# Patient Record
Sex: Female | Born: 1980 | Race: Black or African American | Hispanic: No | Marital: Single | Smoking: Never smoker
Health system: Southern US, Community
[De-identification: ages and names within clinical notes are randomized; demographics above are authoritative.]

## PROBLEM LIST (undated history)

## (undated) DIAGNOSIS — N179 Acute kidney failure, unspecified: Secondary | ICD-10-CM

## (undated) DIAGNOSIS — D7582 Heparin induced thrombocytopenia (HIT): Secondary | ICD-10-CM

## (undated) DIAGNOSIS — I469 Cardiac arrest, cause unspecified: Secondary | ICD-10-CM

## (undated) DIAGNOSIS — J9621 Acute and chronic respiratory failure with hypoxia: Secondary | ICD-10-CM

## (undated) DIAGNOSIS — D352 Benign neoplasm of pituitary gland: Secondary | ICD-10-CM

## (undated) DIAGNOSIS — G931 Anoxic brain damage, not elsewhere classified: Secondary | ICD-10-CM

## (undated) DIAGNOSIS — J181 Lobar pneumonia, unspecified organism: Secondary | ICD-10-CM

## (undated) DIAGNOSIS — I2699 Other pulmonary embolism without acute cor pulmonale: Secondary | ICD-10-CM

## (undated) DIAGNOSIS — E232 Diabetes insipidus: Secondary | ICD-10-CM

## (undated) DIAGNOSIS — D75829 Heparin-induced thrombocytopenia, unspecified: Secondary | ICD-10-CM

## (undated) DIAGNOSIS — E230 Hypopituitarism: Secondary | ICD-10-CM

## (undated) HISTORY — PX: TRACHEOSTOMY: SUR1362

## (undated) HISTORY — PX: OTHER SURGICAL HISTORY: SHX169

## (undated) HISTORY — PX: PEG PLACEMENT: SHX5437

---

## 2017-07-24 ENCOUNTER — Inpatient Hospital Stay
Admission: AC | Admit: 2017-07-24 | Discharge: 2017-09-17 | Disposition: A | Discharge status: Skilled Nursing Facility | Payer: Medicaid Other | Source: Other Acute Inpatient Hospital | Attending: Internal Medicine | Admitting: Internal Medicine

## 2017-07-24 ENCOUNTER — Other Ambulatory Visit (HOSPITAL_COMMUNITY): Payer: Medicaid Other

## 2017-07-24 DIAGNOSIS — E230 Hypopituitarism: Secondary | ICD-10-CM

## 2017-07-24 DIAGNOSIS — Z931 Gastrostomy status: Secondary | ICD-10-CM

## 2017-07-24 DIAGNOSIS — J181 Lobar pneumonia, unspecified organism: Secondary | ICD-10-CM

## 2017-07-24 DIAGNOSIS — K9423 Gastrostomy malfunction: Secondary | ICD-10-CM

## 2017-07-24 DIAGNOSIS — N179 Acute kidney failure, unspecified: Secondary | ICD-10-CM

## 2017-07-24 DIAGNOSIS — Z4659 Encounter for fitting and adjustment of other gastrointestinal appliance and device: Secondary | ICD-10-CM

## 2017-07-24 DIAGNOSIS — I2699 Other pulmonary embolism without acute cor pulmonale: Secondary | ICD-10-CM | POA: Diagnosis present

## 2017-07-24 DIAGNOSIS — J9621 Acute and chronic respiratory failure with hypoxia: Secondary | ICD-10-CM

## 2017-07-24 DIAGNOSIS — Z431 Encounter for attention to gastrostomy: Secondary | ICD-10-CM

## 2017-07-24 DIAGNOSIS — J9690 Respiratory failure, unspecified, unspecified whether with hypoxia or hypercapnia: Secondary | ICD-10-CM

## 2017-07-24 DIAGNOSIS — G931 Anoxic brain damage, not elsewhere classified: Secondary | ICD-10-CM

## 2017-07-24 DIAGNOSIS — R109 Unspecified abdominal pain: Secondary | ICD-10-CM

## 2017-07-24 DIAGNOSIS — Z0189 Encounter for other specified special examinations: Secondary | ICD-10-CM

## 2017-07-24 HISTORY — DX: Cardiac arrest, cause unspecified: I46.9

## 2017-07-24 HISTORY — DX: Heparin induced thrombocytopenia (HIT): D75.82

## 2017-07-24 HISTORY — DX: Benign neoplasm of pituitary gland: D35.2

## 2017-07-24 HISTORY — DX: Hypopituitarism: E23.0

## 2017-07-24 HISTORY — DX: Acute and chronic respiratory failure with hypoxia: J96.21

## 2017-07-24 HISTORY — DX: Acute kidney failure, unspecified: N17.9

## 2017-07-24 HISTORY — DX: Other pulmonary embolism without acute cor pulmonale: I26.99

## 2017-07-24 HISTORY — DX: Diabetes insipidus: E23.2

## 2017-07-24 HISTORY — DX: Anoxic brain damage, not elsewhere classified: G93.1

## 2017-07-24 HISTORY — DX: Heparin-induced thrombocytopenia, unspecified: D75.829

## 2017-07-24 HISTORY — DX: Lobar pneumonia, unspecified organism: J18.1

## 2017-07-24 LAB — APTT: aPTT: 69 seconds — ABNORMAL HIGH (ref 24–36)

## 2017-07-24 MED ORDER — IOPAMIDOL (ISOVUE-300) INJECTION 61%
INTRAVENOUS | Status: AC
  Administered 2017-07-24: 50 mL via GASTROSTOMY
  Filled 2017-07-24: qty 50

## 2017-07-25 ENCOUNTER — Encounter: Payer: Self-pay | Admitting: Internal Medicine

## 2017-07-25 DIAGNOSIS — J9621 Acute and chronic respiratory failure with hypoxia: Secondary | ICD-10-CM | POA: Diagnosis not present

## 2017-07-25 DIAGNOSIS — N179 Acute kidney failure, unspecified: Secondary | ICD-10-CM | POA: Diagnosis not present

## 2017-07-25 DIAGNOSIS — E230 Hypopituitarism: Secondary | ICD-10-CM

## 2017-07-25 DIAGNOSIS — G931 Anoxic brain damage, not elsewhere classified: Secondary | ICD-10-CM

## 2017-07-25 DIAGNOSIS — I2699 Other pulmonary embolism without acute cor pulmonale: Secondary | ICD-10-CM

## 2017-07-25 DIAGNOSIS — J181 Lobar pneumonia, unspecified organism: Secondary | ICD-10-CM | POA: Diagnosis not present

## 2017-07-25 HISTORY — DX: Acute and chronic respiratory failure with hypoxia: J96.21

## 2017-07-25 HISTORY — DX: Acute kidney failure, unspecified: N17.9

## 2017-07-25 HISTORY — DX: Lobar pneumonia, unspecified organism: J18.1

## 2017-07-25 HISTORY — DX: Hypopituitarism: E23.0

## 2017-07-25 HISTORY — DX: Anoxic brain damage, not elsewhere classified: G93.1

## 2017-07-25 LAB — APTT
APTT: 90 s — AB (ref 24–36)
aPTT: 56 seconds — ABNORMAL HIGH (ref 24–36)
aPTT: 102 seconds — ABNORMAL HIGH (ref 24–36)

## 2017-07-25 LAB — PROTIME-INR
INR: 6.5 — AB
INR: 2.65
INR: 6.62
Prothrombin Time: 56.6 seconds — ABNORMAL HIGH (ref 11.4–15.2)
Prothrombin Time: 28.1 seconds — ABNORMAL HIGH (ref 11.4–15.2)
Prothrombin Time: 57.3 seconds — ABNORMAL HIGH (ref 11.4–15.2)

## 2017-07-25 LAB — COMPREHENSIVE METABOLIC PANEL
ALBUMIN: 2.7 g/dL — AB (ref 3.5–5.0)
ALK PHOS: 95 U/L (ref 38–126)
ALT: 20 U/L (ref 14–54)
AST: 27 U/L (ref 15–41)
Anion gap: 11 (ref 5–15)
BUN: 19 mg/dL (ref 6–20)
CALCIUM: 9.4 mg/dL (ref 8.9–10.3)
CHLORIDE: 102 mmol/L (ref 101–111)
CO2: 33 mmol/L — AB (ref 22–32)
Creatinine, Ser: 0.77 mg/dL (ref 0.44–1.00)
GFR calc Af Amer: >60 mL/min (ref >60)
GFR calc non Af Amer: >60 mL/min (ref >60)
GLUCOSE: 83 mg/dL (ref 65–99)
Potassium: 3.4 mmol/L — ABNORMAL LOW (ref 3.5–5.1)
SODIUM: 146 mmol/L — AB (ref 135–145)
Total Bilirubin: 0.8 mg/dL (ref 0.3–1.2)
Total Protein: 6.4 g/dL — ABNORMAL LOW (ref 6.5–8.1)

## 2017-07-25 LAB — CBC
HCT: 31.3 % — ABNORMAL LOW (ref 36.0–46.0)
HEMOGLOBIN: 9.3 g/dL — AB (ref 12.0–15.0)
MCH: 25.5 pg — AB (ref 26.0–34.0)
MCHC: 29.7 g/dL — AB (ref 30.0–36.0)
MCV: 85.8 fL (ref 78.0–100.0)
Platelets: 363 10*3/uL (ref 150–400)
RBC: 3.65 MIL/uL — AB (ref 3.87–5.11)
RDW: 16.5 % — ABNORMAL HIGH (ref 11.5–15.5)
WBC: 11.6 10*3/uL — ABNORMAL HIGH (ref 4.0–10.5)

## 2017-07-25 LAB — C DIFFICILE QUICK SCREEN W PCR REFLEX
C DIFFICILE (CDIFF) TOXIN: NEGATIVE
C DIFFICLE (CDIFF) ANTIGEN: POSITIVE — AB

## 2017-07-25 LAB — TSH: TSH: 0.13 u[IU]/mL — ABNORMAL LOW (ref 0.350–4.500)

## 2017-07-25 LAB — CLOSTRIDIUM DIFFICILE BY PCR, REFLEXED: CDIFFPCR: POSITIVE — AB

## 2017-07-25 NOTE — Consult Note (Signed)
Pulmonary [place]  PULMONARY SERVICE  Date of Service: 07/25/2017  PULMONARY CONSULT   Debra Sanford  KVQ:[id number]  DOB: Mar 03, 1980   DOA: 07/24/2017  Referring Physician: Merton Border, MD  HPI: Debra Sanford is a 37 y.o. female seen for follow up of Acute on Chronic Respiratory Failure.  This is an unfortunate female who presented to the hospital because she was not feeling well.  Apparently was at home told her daughter that she was not feeling well and developed sudden onset of shortness of breath and was noted to be significantly hypoxic.  The patient was taken to the ED by EMS and developed pulseless electrical activity arrest which was witnessed.  The downtime was anywhere from 3 to 5 minutes.  Patient did require CPR at the time.  Diagnostic work-up showed that patient had a pulmonary embolism.  She been started on heparin initially however she developed heparin-induced thrombocytopenia and since then has been switched over to warfarin.  Other complications that occurred during her stay included development of acute renal failure.  She required dialysis in the form of CRRT but now apparently has been off of the dialysis.  At the time that she is seen she is nonverbal she is on a tracheostomy and is on T collar.  Review of Systems:  ROS performed and is unremarkable other than noted above.  Past Medical History:  Diagnosis Date  . Acute on chronic respiratory failure with hypoxia ([place]) 07/25/2017  . Acute renal failure (ARF) ([place]) 07/25/2017  . Cardiac arrest ([place])   . Chronic anoxic encephalopathy ([place]) 07/25/2017  . Diabetes insipidus ([place])   . HIT (heparin-induced thrombocytopenia) ([place])   . Lobar pneumonia ([place]) 07/25/2017  . 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 Pituitary adenoma ([place])   . Pulmonary embolism [facility])     Past Surgical History:  Procedure Laterality Date   . PEG PLACEMENT    . TRACHEOSTOMY    . TSS      Social History:    reports that she has never smoked. She has never used smokeless tobacco. She reports that she drank alcohol. She reports that she has current or past drug history.  Family History: Non-Contributory to the present illness  Allergies not on file  Medications: Reviewed on Rounds  Physical Exam:  Vitals: Temperature is 98.7 pulse 86 respiratory rate 22 blood pressure 121/76 saturations 100%  Ventilator Settings off of the ventilator on T collar  . General: Comfortable at this time . Eyes: Grossly normal lids, irises & conjunctiva . ENT: grossly tongue is normal . Neck: no obvious mass . Cardiovascular: S1-S2 normal no gallop or rub . Respiratory: Coarse rhonchi noted bilaterally expansion is equal . Abdomen: Morbidly obese . Skin: no rash seen on limited exam . Musculoskeletal: not rigid . Psychiatric:unable to assess . Neurologic: no seizure no involuntary movements         Labs on Admission:  Basic Metabolic Panel: Recent Labs  Lab 07/25/17 0531  NA 146*  K 3.4*  CL 102  CO2 33*  GLUCOSE 83  BUN 19  CREATININE 0.77  CALCIUM 9.4    Liver Function Tests: Recent Labs  Lab 07/25/17 0531  AST 27  ALT 20  ALKPHOS 95  BILITOT 0.8  PROT 6.4*  ALBUMIN 2.7*   No results for input(s): LIPASE, AMYLASE in the last 168 hours. No results for input(s): AMMONIA in the last 168 hours.  CBC: Recent Labs  Lab 07/25/17 0531  WBC 11.6*  HGB 9.3*  HCT 31.3*  MCV 85.8  PLT 363    Cardiac Enzymes: No results for input(s): CKTOTAL, CKMB, CKMBINDEX, TROPONINI in the last 168 hours.  BNP (last 3 results) No results for input(s): BNP in the last 8760 hours.  ProBNP (last 3 results) No results for input(s): PROBNP in the last 8760 hours.   Radiological Exams on Admission: Dg Abdomen Peg Tube Location  Result Date: 07/24/2017 CLINICAL DATA:  Evaluate PEG tube. EXAM: ABDOMEN - 1 VIEW  COMPARISON:  None. FINDINGS: A PEG tube projects over the gastric body. Contrast has been injected through the tube entering the stomach and proximal duodenum with no evidence of leak. IMPRESSION: Peg tube placement as above.  No evidence of leak after injection. Electronically Signed   By: Dorise Bullion III M.D   On: 07/24/2017 21:56   Dg Chest Port 1 View  Result Date: 07/24/2017 CLINICAL DATA:  Patient with respiratory failure EXAM: PORTABLE CHEST 1 VIEW COMPARISON:  None. FINDINGS: Central venous catheter tip projects over the superior vena cava. ET tube terminates in the mid trachea. Marked enlargement of the cardiopericardial silhouette. Low lung volumes. Bilateral interstitial pulmonary opacities. Probable small left pleural effusion. IMPRESSION: Marked enlargement of the cardiopericardial silhouette which may represent marked cardiomegaly and/or pericardial effusion. Bilateral interstitial opacities favored to represent pulmonary edema. Small left pleural effusion. ET tube mid trachea. Electronically Signed   By: Lovey Newcomer M.D.   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 Acute on chronic respiratory failure with hypoxia she has had a prolonged course right now her state of consciousness is the main issue.  She is on T collar and has been weaned down successfully to this level.  My concern would be if she were going to be able to eventually decannulate her we will need to first downsize her change her trach and then assess for PMV and capping. 2. Lobar pneumonia she is been treated with antibiotics the last chest x-ray showed some interstitial infiltrates consistent with may be some fluid  overload. 3. Panhypopituitarism continue with supportive care 4. Pulmonary embolism on anticoagulation we will continue present management. 5. Chronic anoxic encephalopathy we will continue present management 6. Acute renal failure monitor labs currently is off dialysis  I have personally seen and evaluated the patient, evaluated laboratory and imaging results, formulated the assessment and plan and placed orders. The Patient requires high complexity decision making for assessment and support.  Case was discussed on Rounds with the Respiratory Therapy Staff  Time Spent 54minutes reviewing the chart radiological studies and discussion with the medical staff and primary care physician and the father was at the bedside  Allyne Gee, MD [facility]

## 2017-07-26 DIAGNOSIS — N179 Acute kidney failure, unspecified: Secondary | ICD-10-CM | POA: Diagnosis not present

## 2017-07-26 DIAGNOSIS — J181 Lobar pneumonia, unspecified organism: Secondary | ICD-10-CM | POA: Diagnosis not present

## 2017-07-26 DIAGNOSIS — G931 Anoxic brain damage, not elsewhere classified: Secondary | ICD-10-CM | POA: Diagnosis not present

## 2017-07-26 DIAGNOSIS — J9621 Acute and chronic respiratory failure with hypoxia: Secondary | ICD-10-CM | POA: Diagnosis not present

## 2017-07-26 LAB — APTT: APTT: 84 s — AB (ref 24–36)

## 2017-07-26 LAB — PROTIME-INR
INR: 3.52
PROTHROMBIN TIME: 35.1 s — AB (ref 11.4–15.2)

## 2017-07-26 NOTE — Progress Notes (Signed)
Pulmonary [place]   PULMONARY SERVICE  PROGRESS NOTE  Date of Service: 07/26/2017  Debra Sanford  [id number]  DOB: [birth year]   DOA: 07/24/2017  Referring Physician: Merton Border, MD  HPI: Debra Sanford is a 37 y.o. female seen for follow up of Acute on Chronic Respiratory Failure.  She is on T collar today has been on 20% oxygen tank fairly well at baseline no distress is noted at this time.  Medications: Reviewed on Rounds  Physical Exam:  Vitals: Temperature 98.0 pulse 61 respiratory rate 12 blood pressure 142/97 saturations 100%  Ventilator Settings currently is off the ventilator on T collar has been on 20% oxygen.  . General: Comfortable at this time . Eyes: Grossly normal lids, irises & conjunctiva . ENT: grossly tongue is normal . Neck: no obvious mass . Cardiovascular: S1 S2 normal no gallop . Respiratory: Coarse breath sounds no rhonchi are noted . Abdomen: soft . Skin: no rash seen on limited exam . Musculoskeletal: not rigid . Psychiatric:unable to assess . Neurologic: no seizure no involuntary movements         Labs on Admission:  Basic Metabolic Panel: Recent Labs  Lab 07/25/17 0531  NA 146*  K 3.4*  CL 102  CO2 33*  GLUCOSE 83  BUN 19  CREATININE 0.77  CALCIUM 9.4    Liver Function Tests: Recent Labs  Lab 07/25/17 0531  AST 27  ALT 20  ALKPHOS 95  BILITOT 0.8  PROT 6.4*  ALBUMIN 2.7*   No results for input(s): LIPASE, AMYLASE in the last 168 hours. No results for input(s): AMMONIA in the last 168 hours.  CBC: Recent Labs  Lab 07/25/17 0531  WBC 11.6*  HGB 9.3*  HCT 31.3*  MCV 85.8  PLT 363    Cardiac Enzymes: No results for input(s): CKTOTAL, CKMB, CKMBINDEX, TROPONINI in the last 168 hours.  BNP (last 3 results) No results for input(s): BNP in the last 8760 hours.  ProBNP (last 3 results) No results for input(s): PROBNP in the last 8760  hours.  Radiological Exams on Admission: Dg Abdomen Peg Tube Location  Result Date: 07/24/2017 CLINICAL DATA:  Evaluate PEG tube. EXAM: ABDOMEN - 1 VIEW COMPARISON:  None. FINDINGS: A PEG tube projects over the gastric body. Contrast has been injected through the tube entering the stomach and proximal duodenum with no evidence of leak. IMPRESSION: Peg tube placement as above.  No evidence of leak after injection. Electronically Signed   By: Dorise Bullion III M.D   On: 07/24/2017 21:56   Dg Chest Port 1 View  Result Date: 07/24/2017 CLINICAL DATA:  Patient with respiratory failure EXAM: PORTABLE CHEST 1 VIEW COMPARISON:  None. FINDINGS: Central venous catheter tip projects over the superior vena cava. ET tube terminates in the mid trachea. Marked enlargement of the cardiopericardial silhouette. Low lung volumes. Bilateral interstitial pulmonary opacities. Probable small left pleural effusion. IMPRESSION: Marked enlargement of the cardiopericardial silhouette which may represent marked cardiomegaly and/or pericardial effusion. Bilateral interstitial opacities favored to represent pulmonary edema. Small left pleural effusion. ET tube mid trachea. Electronically Signed   By: Lovey Newcomer M.D.   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 Acute  on chronic respiratory failure with hypoxia we will continue with T collar trials.  Respiratory we will continue to assess for potential of capping which I think is going to be somewhat difficult secondary to probable severe OSA we will make that determination after initial evaluation with PMV. 2. Lobar pneumonia treated with antibiotics we will continue with supportive care follow-up  x-ray showing bilateral interstitial infiltrates. 3. Panhypopituitarism we will continue with present management. 4. Acute renal failure resolved we will continue to monitor 5. Anoxic encephalopathy at baseline no changes noted 6. Pulmonary embolism continue with present management prognosis guarded   I have personally seen and evaluated the patient, evaluated laboratory and imaging results, formulated the assessment and plan and placed orders. The Patient requires high complexity decision making for assessment and support.  Case was discussed on Rounds with the Respiratory Therapy Staff  Allyne Gee, MD [facility] Pulmonary Critical Care Medicine Sleep Medicine

## 2017-07-27 DIAGNOSIS — N179 Acute kidney failure, unspecified: Secondary | ICD-10-CM | POA: Diagnosis not present

## 2017-07-27 DIAGNOSIS — J9621 Acute and chronic respiratory failure with hypoxia: Secondary | ICD-10-CM | POA: Diagnosis not present

## 2017-07-27 DIAGNOSIS — G931 Anoxic brain damage, not elsewhere classified: Secondary | ICD-10-CM | POA: Diagnosis not present

## 2017-07-27 DIAGNOSIS — J181 Lobar pneumonia, unspecified organism: Secondary | ICD-10-CM | POA: Diagnosis not present

## 2017-07-27 LAB — PROTIME-INR
INR: >10
Prothrombin Time: >90 seconds — ABNORMAL HIGH (ref 11.4–15.2)

## 2017-07-27 LAB — APTT: aPTT: >200 seconds (ref 24–36)

## 2017-07-27 NOTE — Progress Notes (Signed)
[name]   PULMONARY SERVICE  PROGRESS NOTE  Date of Service: 07/27/2017  Debra Sanford  [id number]  DOB: [birth year]   DOA: 07/24/2017  Referring Physician: Merton Border, MD  HPI: Debra Sanford is a 37 y.o. female seen for follow up of Acute on Chronic Respiratory Failure.  Currently is on T collar is on 20% oxygen.  Patient has been apparently having some bradycardia events noted.  He patient also has been having some thermoregulatory problems likely related to her tumor issues.  Medications: Reviewed on Rounds  Physical Exam:  Vitals: Temperature 97.1 pulse 61 respiratory rate 14 blood pressure 148/68 saturations 98%  Ventilator Settings off of the ventilator on T collar 28% FiO2  . General: Comfortable at this time . Eyes: Grossly normal lids, irises & conjunctiva . ENT: grossly tongue is normal . Neck: no obvious mass . Cardiovascular: S1 S2 normal no gallop . Respiratory: No rhonchi expansion is equal . Abdomen: soft . Skin: no rash seen on limited exam . Musculoskeletal: not rigid . Psychiatric:unable to assess . Neurologic: no seizure no involuntary movements         Labs on Admission:  Basic Metabolic Panel: Recent Labs  Lab 07/25/17 0531  NA 146*  K 3.4*  CL 102  CO2 33*  GLUCOSE 83  BUN 19  CREATININE 0.77  CALCIUM 9.4    Liver Function Tests: Recent Labs  Lab 07/25/17 0531  AST 27  ALT 20  ALKPHOS 95  BILITOT 0.8  PROT 6.4*  ALBUMIN 2.7*   No results for input(s): LIPASE, AMYLASE in the last 168 hours. No results for input(s): AMMONIA in the last 168 hours.  CBC: Recent Labs  Lab 07/25/17 0531  WBC 11.6*  HGB 9.3*  HCT 31.3*  MCV 85.8  PLT 363    Cardiac Enzymes: No results for input(s): CKTOTAL, CKMB, CKMBINDEX, TROPONINI in the last 168 hours.  BNP (last 3 results) No results for input(s): BNP in the last 8760 hours.  ProBNP (last 3 results) No  results for input(s): PROBNP in the last 8760 hours.  Radiological Exams on Admission: Dg Abdomen Peg Tube Location  Result Date: 07/24/2017 CLINICAL DATA:  Evaluate PEG tube. EXAM: ABDOMEN - 1 VIEW COMPARISON:  None. FINDINGS: A PEG tube projects over the gastric body. Contrast has been injected through the tube entering the stomach and proximal duodenum with no evidence of leak. IMPRESSION: Peg tube placement as above.  No evidence of leak after injection. Electronically Signed   By: Dorise Bullion III M.D   On: 07/24/2017 21:56   Dg Chest Port 1 View  Result Date: 07/24/2017 CLINICAL DATA:  Patient with respiratory failure EXAM: PORTABLE CHEST 1 VIEW COMPARISON:  None. FINDINGS: Central venous catheter tip projects over the superior vena cava. ET tube terminates in the mid trachea. Marked enlargement of the cardiopericardial silhouette. Low lung volumes. Bilateral interstitial pulmonary opacities. Probable small left pleural effusion. IMPRESSION: Marked enlargement of the cardiopericardial silhouette which may represent marked cardiomegaly and/or pericardial effusion. Bilateral interstitial opacities favored to represent pulmonary edema. Small left pleural effusion. ET tube mid trachea. Electronically Signed   By: Lovey Newcomer M.D.   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 Acute on chronic respiratory failure with hypoxia right now will remain on T collar difficulty with decannulation secondary to her size and underlying sleep apnea. 2. Panhypopituitarism we will continue with supportive care monitor labs closely 3. Acute renal failure follow labs doing well 4. Anoxic encephalopathy she is at  baseline 5. Pulmonary embolism status post IVC filter 6. Lobar pneumonia treated with antibiotics we will continue to monitor   I have personally seen and evaluated the patient, evaluated laboratory and imaging results, formulated the assessment and plan and placed orders. The Patient requires high complexity decision making for assessment and support.  Case was discussed on Rounds with the Respiratory Therapy Staff  Allyne Gee, MD [facility] Pulmonary Critical Care Medicine Sleep Medicine

## 2017-07-28 ENCOUNTER — Other Ambulatory Visit (HOSPITAL_COMMUNITY): Payer: Medicaid Other

## 2017-07-28 DIAGNOSIS — N179 Acute kidney failure, unspecified: Secondary | ICD-10-CM | POA: Diagnosis not present

## 2017-07-28 DIAGNOSIS — J9621 Acute and chronic respiratory failure with hypoxia: Secondary | ICD-10-CM | POA: Diagnosis not present

## 2017-07-28 DIAGNOSIS — G931 Anoxic brain damage, not elsewhere classified: Secondary | ICD-10-CM | POA: Diagnosis not present

## 2017-07-28 DIAGNOSIS — J181 Lobar pneumonia, unspecified organism: Secondary | ICD-10-CM | POA: Diagnosis not present

## 2017-07-28 LAB — BASIC METABOLIC PANEL
Anion gap: 16 — ABNORMAL HIGH (ref 5–15)
BUN: 31 mg/dL — AB (ref 6–20)
CALCIUM: 9.2 mg/dL (ref 8.9–10.3)
CO2: 27 mmol/L (ref 22–32)
Chloride: 102 mmol/L (ref 101–111)
Creatinine, Ser: 1.52 mg/dL — ABNORMAL HIGH (ref 0.44–1.00)
GFR calc Af Amer: 50 mL/min — ABNORMAL LOW (ref >60)
GFR, EST NON AFRICAN AMERICAN: 43 mL/min — AB (ref >60)
GLUCOSE: 115 mg/dL — AB (ref 65–99)
Potassium: 3.3 mmol/L — ABNORMAL LOW (ref 3.5–5.1)
Sodium: 145 mmol/L (ref 135–145)

## 2017-07-28 LAB — BLOOD GAS, ARTERIAL
Acid-Base Excess: 5.5 mmol/L — ABNORMAL HIGH (ref 0.0–2.0)
Bicarbonate: 29.6 mmol/L — ABNORMAL HIGH (ref 20.0–28.0)
FIO2: 40
O2 SAT: 96.2 %
PO2 ART: 86.9 mmHg (ref 83.0–108.0)
Patient temperature: 98.6
pCO2 arterial: 44 mmHg (ref 32.0–48.0)
pH, Arterial: 7.442 (ref 7.350–7.450)

## 2017-07-28 LAB — CBC WITH DIFFERENTIAL/PLATELET
Basophils Absolute: 0.1 K/uL (ref 0.0–0.1)
Basophils Relative: 1 %
Eosinophils Absolute: 0 K/uL (ref 0.0–0.7)
Eosinophils Relative: 0 %
HCT: 42.9 % (ref 36.0–46.0)
Hemoglobin: 12.3 g/dL (ref 12.0–15.0)
Lymphocytes Relative: 26 %
Lymphs Abs: 2.5 K/uL (ref 0.7–4.0)
MCH: 24.2 pg — ABNORMAL LOW (ref 26.0–34.0)
MCHC: 28.7 g/dL — ABNORMAL LOW (ref 30.0–36.0)
MCV: 84.3 fL (ref 78.0–100.0)
Monocytes Absolute: 0.4 K/uL (ref 0.1–1.0)
Monocytes Relative: 4 %
Neutro Abs: 6.7 K/uL (ref 1.7–7.7)
Neutrophils Relative %: 69 %
Platelets: 539 K/uL — ABNORMAL HIGH (ref 150–400)
RBC: 5.09 MIL/uL (ref 3.87–5.11)
RDW: 16.9 % — ABNORMAL HIGH (ref 11.5–15.5)
WBC Morphology: INCREASED
WBC: 9.7 K/uL (ref 4.0–10.5)

## 2017-07-28 LAB — PROTIME-INR
INR: 3.72
Prothrombin Time: 36.6 s — ABNORMAL HIGH (ref 11.4–15.2)

## 2017-07-28 MED ORDER — IOPAMIDOL (ISOVUE-300) INJECTION 61%
INTRAVENOUS | Status: AC
  Administered 2017-07-28: 30 mL via GASTROSTOMY
  Filled 2017-07-28: qty 50

## 2017-07-28 NOTE — Progress Notes (Signed)
Pulmonary [place]   PULMONARY SERVICE  PROGRESS NOTE  Date of Service: 07/28/2017  Debra Sanford  [id number]  DOB: 09-11-1980   DOA: 07/24/2017  Referring Physician: Merton Border, MD  HPI: Debra Sanford is a 37 y.o. female seen for follow up of Acute on Chronic Respiratory Failure.  Currently on T collar trials doing fairly well.  Has had episodes with tachycardia noted now is doing a little bit better likely neurologically mediated  Medications: Reviewed on Rounds  Physical Exam:  Vitals: Temperature 98.7 pulse 122 respiratory rate 30 blood pressure 100/54 saturations 93%  Ventilator Settings off the ventilator on T collar 28%  . General: Comfortable at this time . Eyes: Grossly normal lids, irises & conjunctiva . ENT: grossly tongue is normal . Neck: no obvious mass . Cardiovascular: S1 S2 normal no gallop . Respiratory: Scattered rhonchi . Abdomen: soft . Skin: no rash seen on limited exam . Musculoskeletal: not rigid . Psychiatric:unable to assess . Neurologic: no seizure no involuntary movements         Lab Data:   Basic Metabolic Panel: Recent Labs  Lab 07/25/17 0531 07/28/17 0829  NA 146* 145  K 3.4* 3.3*  CL 102 102  CO2 33* 27  GLUCOSE 83 115*  BUN 19 31*  CREATININE 0.77 1.52*  CALCIUM 9.4 9.2    Liver Function Tests: Recent Labs  Lab 07/25/17 0531  AST 27  ALT 20  ALKPHOS 95  BILITOT 0.8  PROT 6.4*  ALBUMIN 2.7*   No results for input(s): LIPASE, AMYLASE in the last 168 hours. No results for input(s): AMMONIA in the last 168 hours.  CBC: Recent Labs  Lab 07/25/17 0531 07/28/17 0835  WBC 11.6* 9.7  NEUTROABS  --  6.7  HGB 9.3* 12.3  HCT 31.3* 42.9  MCV 85.8 84.3  PLT 363 539*    Cardiac Enzymes: No results for input(s): CKTOTAL, CKMB, CKMBINDEX, TROPONINI in the last 168 hours.  BNP (last 3 results) No results for input(s): BNP in the last 8760 hours.  ProBNP  (last 3 results) No results for input(s): PROBNP in the last 8760 hours.  Radiological Exams: Dg Abdomen Peg Tube Location  Result Date: 07/28/2017 CLINICAL DATA:  Status post replacement of PEG tube. EXAM: ABDOMEN - 1 VIEW COMPARISON:  None. FINDINGS: Contrast injected into the patient's feeding tube opacifies the stomach. IMPRESSION: Feeding tube in good position. Electronically Signed   By: Inge Rise M.D.   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 Acute on chronic respiratory failure with hypoxia we will continue with T collar trials at this time.  Continue pulmonary toilet secretion management.  Hold off on any capping or decannulation for now. 2. Lobar pneumonia treated with antibiotics we will continue supportive care 3. Acute renal failure at baseline we will follow 4. Chronic encephalopathy anoxic encephalopathy unchanged 5. Pulmonary embolism status post IVC filter 6. Panhypopituitarism unchanged   I have personally seen and evaluated the patient, evaluated laboratory and imaging results, formulated the assessment and plan and placed orders. The Patient requires high complexity decision making for assessment and support.  Case was discussed on Rounds with the Respiratory Therapy Staff  Saadat A  Humphrey Rolls, MD [facility] Pulmonary Critical Care Medicine Sleep Medicine

## 2017-07-29 DIAGNOSIS — J181 Lobar pneumonia, unspecified organism: Secondary | ICD-10-CM | POA: Diagnosis not present

## 2017-07-29 DIAGNOSIS — J9621 Acute and chronic respiratory failure with hypoxia: Secondary | ICD-10-CM | POA: Diagnosis not present

## 2017-07-29 DIAGNOSIS — N179 Acute kidney failure, unspecified: Secondary | ICD-10-CM | POA: Diagnosis not present

## 2017-07-29 DIAGNOSIS — G931 Anoxic brain damage, not elsewhere classified: Secondary | ICD-10-CM | POA: Diagnosis not present

## 2017-07-29 LAB — BLOOD GAS, ARTERIAL
ACID-BASE EXCESS: 6.6 mmol/L — AB (ref 0.0–2.0)
BICARBONATE: 30 mmol/L — AB (ref 20.0–28.0)
FIO2: 40
LHR: 20 {breaths}/min
MECHVT: 450 mL
O2 SAT: 91.8 %
PATIENT TEMPERATURE: 99.9
PCO2 ART: 39.8 mmHg (ref 32.0–48.0)
PEEP: 5 cmH2O
PH ART: 7.493 — AB (ref 7.350–7.450)
pO2, Arterial: 65.1 mmHg — ABNORMAL LOW (ref 83.0–108.0)

## 2017-07-29 LAB — PREGNANCY, URINE: Preg Test, Ur: NEGATIVE

## 2017-07-29 NOTE — Progress Notes (Signed)
Pulmonary [place]   PULMONARY SERVICE  PROGRESS NOTE  Date of Service: 07/29/2017  Korayma Hagwood  [id number]  DOB: 1980/11/01   DOA: 07/24/2017  Referring Physician: Merton Border, MD  HPI: Debra Sanford is a 37 y.o. female seen for follow up of Acute on Chronic Respiratory Failure.  Patient is on assist control mode by now is on 40% oxygen with PEEP of 5.  Patient seems to be comfortable without distress at this time.  Apparently had to be placed on the ventilator overnight or drop in saturations  Medications: Reviewed on Rounds  Physical Exam:  Vitals: Temperature 98.2 pulse 120 respiratory rate 18 blood pressure 108/60 saturations 93%  Ventilator Settings mode of ventilation assist control FiO2 40% tidal volume 396 PEEP 5  . General: Comfortable at this time . Eyes: Grossly normal lids, irises & conjunctiva . ENT: grossly tongue is normal . Neck: no obvious mass . Cardiovascular: S1 S2 normal no gallop . Respiratory: Good air entry no rhonchi expansion is equal . Abdomen: soft . Skin: no rash seen on limited exam . Musculoskeletal: not rigid . Psychiatric:unable to assess . Neurologic: no seizure no involuntary movements         Lab Data:   Basic Metabolic Panel: Recent Labs  Lab 07/25/17 0531 07/28/17 0829  NA 146* 145  K 3.4* 3.3*  CL 102 102  CO2 33* 27  GLUCOSE 83 115*  BUN 19 31*  CREATININE 0.77 1.52*  CALCIUM 9.4 9.2    Liver Function Tests: Recent Labs  Lab 07/25/17 0531  AST 27  ALT 20  ALKPHOS 95  BILITOT 0.8  PROT 6.4*  ALBUMIN 2.7*   No results for input(s): LIPASE, AMYLASE in the last 168 hours. No results for input(s): AMMONIA in the last 168 hours.  CBC: Recent Labs  Lab 07/25/17 0531 07/28/17 0835  WBC 11.6* 9.7  NEUTROABS  --  6.7  HGB 9.3* 12.3  HCT 31.3* 42.9  MCV 85.8 84.3  PLT 363 539*    Cardiac Enzymes: No results for input(s): CKTOTAL, CKMB,  CKMBINDEX, TROPONINI in the last 168 hours.  BNP (last 3 results) No results for input(s): BNP in the last 8760 hours.  ProBNP (last 3 results) No results for input(s): PROBNP in the last 8760 hours.  Radiological Exams: Dg Abdomen Peg Tube Location  Result Date: 07/28/2017 CLINICAL DATA:  Status post replacement of PEG tube. EXAM: ABDOMEN - 1 VIEW COMPARISON:  None. FINDINGS: Contrast injected into the patient's feeding tube opacifies the stomach. IMPRESSION: Feeding tube in good position. Electronically Signed   By: Inge Rise M.D.   On: 07/28/2017 15:33   Dg Chest Port 1 View  Result Date: 07/28/2017 CLINICAL DATA:  Respiratory failure EXAM: PORTABLE CHEST 1 VIEW COMPARISON:  07/24/2017 FINDINGS: Tracheostomy tube is noted in satisfactory position. Cardiac shadow remains enlarged. The overall inspiratory effort is poor. Mild interstitial changes are again identified and stable. No new focal abnormality is seen. Right-sided PICC line is again noted and stable. IMPRESSION: No significant change from the prior exam. Electronically Signed   By: Inez Catalina M.D.   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 Acute on chronic respiratory  failure with hypoxia we will continue with full vent support right now we will check the spontaneous breathing index tomorrow and try to resume back her on her wean.  Continue with pulmonary toilet supportive care. 2. Lobar pneumonia treated with antibiotics last chest x-ray showed no significant change with mild interstitial changes noted. 3. Chronic encephalopathy she is at baseline 4. Acute renal failure follow labs 5. Panhypopituitarism continue with supportive care   I  have personally seen and evaluated the patient, evaluated laboratory and imaging results, formulated the assessment and plan and placed orders. The Patient requires high complexity decision making for assessment and support.  Case was discussed on Rounds with the Respiratory Therapy Staff  Allyne Gee, MD [facility] Pulmonary Critical Care Medicine Sleep Medicine

## 2017-07-30 ENCOUNTER — Other Ambulatory Visit (HOSPITAL_COMMUNITY): Payer: Medicaid Other

## 2017-07-30 LAB — BASIC METABOLIC PANEL
ANION GAP: 12 (ref 5–15)
BUN: 56 mg/dL — AB (ref 6–20)
CHLORIDE: 100 mmol/L — AB (ref 101–111)
CO2: 34 mmol/L — ABNORMAL HIGH (ref 22–32)
Calcium: 8.9 mg/dL (ref 8.9–10.3)
Creatinine, Ser: 1.46 mg/dL — ABNORMAL HIGH (ref 0.44–1.00)
GFR calc Af Amer: 53 mL/min — ABNORMAL LOW (ref >60)
GFR calc non Af Amer: 46 mL/min — ABNORMAL LOW (ref >60)
Glucose, Bld: 133 mg/dL — ABNORMAL HIGH (ref 65–99)
POTASSIUM: 3.5 mmol/L (ref 3.5–5.1)
SODIUM: 146 mmol/L — AB (ref 135–145)

## 2017-07-30 LAB — CBC
HCT: 38.7 % (ref 36.0–46.0)
HEMOGLOBIN: 11.4 g/dL — AB (ref 12.0–15.0)
MCH: 23.9 pg — AB (ref 26.0–34.0)
MCHC: 29.5 g/dL — ABNORMAL LOW (ref 30.0–36.0)
MCV: 81.3 fL (ref 78.0–100.0)
Platelets: 321 10*3/uL (ref 150–400)
RBC: 4.76 MIL/uL (ref 3.87–5.11)
RDW: 17.3 % — ABNORMAL HIGH (ref 11.5–15.5)
WBC: 11.5 10*3/uL — ABNORMAL HIGH (ref 4.0–10.5)

## 2017-07-30 LAB — PROTIME-INR
INR: 2.96
PROTHROMBIN TIME: 30.6 s — AB (ref 11.4–15.2)

## 2017-07-30 NOTE — Progress Notes (Addendum)
Patient ID: Debra Sanford, female   DOB: Nov 13, 1980, 37 y.o.   MRN: [id number]  CT today:  IMPRESSION: Gastrostomy tube tip is outside the stomach. Spillage of instilled contents into the abdomen and pelvis with free fluid or free air throughout the abdomen and pelvis suspected. Gastrostomy tube is not safe for use and needs to be replaced. The presence of free fluid/free air limits evaluation for detection of bowel perforation. Clinical correlation recommended. Bibasilar consolidation and pleural effusions.  These results were called by telephone at the time of interpretation on 07/30/2017 at 6:50 am [facility] head nurse , who verbally acknowledged these results. Request to call physician on call with results and not use gastrostomy tube    Discussed with Dr Bascom Levels to pulled G tube Pulled without issue G tube removed in entirety Bandage placed  New orders placed to keep wound clean and dry Npo til healed Must be completely healed before consideration of new placement

## 2017-07-31 DIAGNOSIS — G931 Anoxic brain damage, not elsewhere classified: Secondary | ICD-10-CM | POA: Diagnosis not present

## 2017-07-31 DIAGNOSIS — J181 Lobar pneumonia, unspecified organism: Secondary | ICD-10-CM | POA: Diagnosis not present

## 2017-07-31 DIAGNOSIS — J9621 Acute and chronic respiratory failure with hypoxia: Secondary | ICD-10-CM | POA: Diagnosis not present

## 2017-07-31 DIAGNOSIS — N179 Acute kidney failure, unspecified: Secondary | ICD-10-CM | POA: Diagnosis not present

## 2017-07-31 LAB — PROTIME-INR
INR: 2.03
Prothrombin Time: 22.8 seconds — ABNORMAL HIGH (ref 11.4–15.2)

## 2017-07-31 NOTE — Progress Notes (Signed)
Pulmonary [place]   PULMONARY SERVICE  PROGRESS NOTE  Date of Service: 07/31/2017  Debra Sanford  NKN:[id number]  DOB: [birth year]   DOA: 07/24/2017  Referring Physician: Merton Border, MD  HPI: Debra Sanford is a 37 y.o. female seen for follow up of Acute on Chronic Respiratory Failure.  Patient did about 4 hours on pressure support now is resting back on assist control mode.  Medications: Reviewed on Rounds  Physical Exam:  Vitals: Temperature 98.8 pulse 96 respiratory rate 25 blood pressure 136/86 saturations 96%  Ventilator Settings mode of ventilation assist control FiO2 30% PEEP 5  . General: Comfortable at this time . Eyes: Grossly normal lids, irises & conjunctiva . ENT: grossly tongue is normal . Neck: no obvious mass . Cardiovascular: S1 S2 normal no gallop . Respiratory: No rhonchi or rales expansion is equal . Abdomen: soft . Skin: no rash seen on limited exam . Musculoskeletal: not rigid . Psychiatric:unable to assess . Neurologic: no seizure no involuntary movements         Lab Data:   Basic Metabolic Panel: Recent Labs  Lab 07/25/17 0531 07/28/17 0829 07/30/17 0838  NA 146* 145 146*  K 3.4* 3.3* 3.5  CL 102 102 100*  CO2 33* 27 34*  GLUCOSE 83 115* 133*  BUN 19 31* 56*  CREATININE 0.77 1.52* 1.46*  CALCIUM 9.4 9.2 8.9    Liver Function Tests: Recent Labs  Lab 07/25/17 0531  AST 27  ALT 20  ALKPHOS 95  BILITOT 0.8  PROT 6.4*  ALBUMIN 2.7*   No results for input(s): LIPASE, AMYLASE in the last 168 hours. No results for input(s): AMMONIA in the last 168 hours.  CBC: Recent Labs  Lab 07/25/17 0531 07/28/17 0835 07/30/17 0838  WBC 11.6* 9.7 11.5*  NEUTROABS  --  6.7  --   HGB 9.3* 12.3 11.4*  HCT 31.3* 42.9 38.7  MCV 85.8 84.3 81.3  PLT 363 539* 321    Cardiac Enzymes: No results for input(s): CKTOTAL, CKMB, CKMBINDEX, TROPONINI in the last 168 hours.  BNP (last 3  results) No results for input(s): BNP in the last 8760 hours.  ProBNP (last 3 results) No results for input(s): PROBNP in the last 8760 hours.  Radiological Exams: Ct Abdomen Pelvis Wo Contrast  Result Date: 07/30/2017 CLINICAL DATA:  37 year old female. Feeding tube leaking. Subsequent encounter. EXAM: CT ABDOMEN AND PELVIS WITHOUT CONTRAST TECHNIQUE: Multidetector CT imaging of the abdomen and pelvis was performed following the standard protocol without IV contrast. COMPARISON:  07/28/2017 plain film exam.  No comparison CT. FINDINGS: Lower chest: Bibasilar consolidation/pleural effusions. Hepatobiliary: Enlarged liver. Streak artifact. Lack of contrast. No obvious liver lesion. Gallbladder sludge without calcified gallstone. Pancreas: Taking into account limitation by non contrast imaging, no pancreatic mass or primary pancreatic inflammation. Spleen: Taking into account limitation by non contrast imaging, no splenic lesion. Top-normal size. Adrenals/Urinary Tract: No obstructing stone or hydronephrosis. Taking into account limitation by non contrast imaging, no worrisome renal or adrenal lesion. Noncontrast filled urinary bladder unremarkable. Stomach/Bowel: Gastrostomy tube tip is outside the stomach. Spillage of instilled contents into the abdomen and pelvis with free fluid or free air throughout the abdomen and pelvis suspected. The presence of free fluid/free air limits evaluation for detection of bowel perforation. Vascular/Lymphatic: No abdominal aortic aneurysm. Scattered normal/top-normal size lymph nodes without adenopathy. Reproductive: No obvious uterine or worrisome adnexal lesion. Other: Third spacing of fluid. Musculoskeletal: Sacroiliac joint erosive changes bilaterally.  IMPRESSION: Gastrostomy tube tip is outside the stomach. Spillage of instilled contents into the abdomen and pelvis with free fluid or free air throughout the abdomen and pelvis suspected. Gastrostomy tube is not safe for  use and needs to be replaced. The presence of free fluid/free air limits evaluation for detection of bowel perforation. Clinical correlation recommended. Bibasilar consolidation and pleural effusions. These results were called by telephone at the time of interpretation on 07/30/2017 at 6:50 am [facility] head nurse , who verbally acknowledged these results. Request to call physician on call with results and not use gastrostomy tube. Electronically Signed   By: Genia Del M.D.   On: 07/30/2017 07:05   Dg Abd Portable 1v  Result Date: 07/30/2017 CLINICAL DATA:  37 year old female with nasogastric tube placement. Initial encounter. EXAM: PORTABLE ABDOMEN - 1 VIEW COMPARISON:  07/30/2017 CT. FINDINGS: Nasogastric tube tip projects at the gastric antrum level with side hole at the gastric body level. Prominent heart size. Gas-filled top-normal size transverse colon. IMPRESSION: Nasogastric tube tip projects at the gastric antrum level with side hole at the gastric body level. Electronically Signed   By: Genia Del M.D.   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 Acute on chronic respiratory failure with hypoxia we will continue with weaning on pressure support as tolerated continue secretion management pulmonary toilet 2. Panhypopituitarism at baseline we will monitor 3. Chronic anoxic encephalopathy stable we will monitor. 4. Acute renal failure labs are stabilized we will follow along 5. Pulmonary embolism at baseline treated 6. Lobar pneumonia treated we will monitor   I have personally seen and evaluated the patient, evaluated laboratory and imaging results, formulated the assessment and plan and placed orders. The  Patient requires high complexity decision making for assessment and support.  Case was discussed on Rounds with the Respiratory Therapy Staff  Allyne Gee, MD [facility] Pulmonary Critical Care Medicine Sleep Medicine

## 2017-08-01 DIAGNOSIS — G931 Anoxic brain damage, not elsewhere classified: Secondary | ICD-10-CM | POA: Diagnosis not present

## 2017-08-01 DIAGNOSIS — J9621 Acute and chronic respiratory failure with hypoxia: Secondary | ICD-10-CM | POA: Diagnosis not present

## 2017-08-01 DIAGNOSIS — J181 Lobar pneumonia, unspecified organism: Secondary | ICD-10-CM | POA: Diagnosis not present

## 2017-08-01 DIAGNOSIS — N179 Acute kidney failure, unspecified: Secondary | ICD-10-CM | POA: Diagnosis not present

## 2017-08-01 LAB — CBC
HEMATOCRIT: 33.5 % — AB (ref 36.0–46.0)
Hemoglobin: 9.7 g/dL — ABNORMAL LOW (ref 12.0–15.0)
MCH: 23.7 pg — AB (ref 26.0–34.0)
MCHC: 29 g/dL — AB (ref 30.0–36.0)
MCV: 81.9 fL (ref 78.0–100.0)
Platelets: 238 10*3/uL (ref 150–400)
RBC: 4.09 MIL/uL (ref 3.87–5.11)
RDW: 17.7 % — AB (ref 11.5–15.5)
WBC: 11.4 10*3/uL — ABNORMAL HIGH (ref 4.0–10.5)

## 2017-08-01 LAB — COMPREHENSIVE METABOLIC PANEL
ALK PHOS: 126 U/L (ref 38–126)
ALT: 19 U/L (ref 14–54)
AST: 20 U/L (ref 15–41)
Albumin: 2 g/dL — ABNORMAL LOW (ref 3.5–5.0)
Anion gap: 12 (ref 5–15)
BUN: 41 mg/dL — AB (ref 6–20)
CALCIUM: 8.7 mg/dL — AB (ref 8.9–10.3)
CO2: 32 mmol/L (ref 22–32)
CREATININE: 0.89 mg/dL (ref 0.44–1.00)
Chloride: 104 mmol/L (ref 101–111)
GFR calc Af Amer: >60 mL/min (ref >60)
Glucose, Bld: 173 mg/dL — ABNORMAL HIGH (ref 65–99)
Potassium: 2.7 mmol/L — CL (ref 3.5–5.1)
Sodium: 148 mmol/L — ABNORMAL HIGH (ref 135–145)
Total Bilirubin: 0.6 mg/dL (ref 0.3–1.2)
Total Protein: 6.6 g/dL (ref 6.5–8.1)

## 2017-08-01 LAB — PROTIME-INR
INR: 1.49
Prothrombin Time: 17.9 seconds — ABNORMAL HIGH (ref 11.4–15.2)

## 2017-08-01 LAB — MAGNESIUM: Magnesium: 2.4 mg/dL (ref 1.7–2.4)

## 2017-08-01 LAB — TRIGLYCERIDES: Triglycerides: 213 mg/dL — ABNORMAL HIGH (ref <150)

## 2017-08-01 NOTE — Progress Notes (Signed)
[name]   PULMONARY SERVICE  PROGRESS NOTE  Date of Service: 08/01/2017  Debra Sanford  GEX:[id number]  DOB: Aug 01, 1980   DOA: 07/24/2017  Referring Physician: Merton Border, MD  HPI: Debra Sanford is a 37 y.o. female seen for follow up of Acute on Chronic Respiratory Failure.  Patient is on pressure support and supposed to go for about 8 hours.  Right now is comfortable had a fever temperature was up to 101.4 and fortunately I think again this is likely related to the underlying neurological issues that she has  Medications: Reviewed on Rounds  Physical Exam:  Vitals: Temperature 101.4 pulse 99 respiratory rate 21 blood pressure 142/85 saturation 94%  Ventilator Settings mode of ventilation pressure support FiO2 30% tidal volume 477 saturations 94% pressure support 12/5  . General: Comfortable at this time . Eyes: Grossly normal lids, irises & conjunctiva . ENT: grossly tongue is normal . Neck: no obvious mass . Cardiovascular: S1 S2 normal no gallop . Respiratory: Good air entry no rhonchi expansion is equal . Abdomen: soft . Skin: no rash seen on limited exam . Musculoskeletal: not rigid . Psychiatric:unable to assess . Neurologic: no seizure no involuntary movements         Lab Data:   Basic Metabolic Panel: Recent Labs  Lab 07/28/17 0829 07/30/17 0838 08/01/17 0525  NA 145 146* 148*  K 3.3* 3.5 2.7*  CL 102 100* 104  CO2 27 34* 32  GLUCOSE 115* 133* 173*  BUN 31* 56* 41*  CREATININE 1.52* 1.46* 0.89  CALCIUM 9.2 8.9 8.7*  MG  --   --  2.4    Liver Function Tests: Recent Labs  Lab 08/01/17 0525  AST 20  ALT 19  ALKPHOS 126  BILITOT 0.6  PROT 6.6  ALBUMIN 2.0*   No results for input(s): LIPASE, AMYLASE in the last 168 hours. No results for input(s): AMMONIA in the last 168 hours.  CBC: Recent Labs  Lab 07/28/17 0835 07/30/17 0838 08/01/17 0525  WBC 9.7 11.5* 11.4*   NEUTROABS 6.7  --   --   HGB 12.3 11.4* 9.7*  HCT 42.9 38.7 33.5*  MCV 84.3 81.3 81.9  PLT 539* 321 238    Cardiac Enzymes: No results for input(s): CKTOTAL, CKMB, CKMBINDEX, TROPONINI in the last 168 hours.  BNP (last 3 results) No results for input(s): BNP in the last 8760 hours.  ProBNP (last 3 results) No results for input(s): PROBNP in the last 8760 hours.  Radiological Exams: Dg Abd Portable 1v  Result Date: 07/30/2017 CLINICAL DATA:  37 year old female with nasogastric tube placement. Initial encounter. EXAM: PORTABLE ABDOMEN - 1 VIEW COMPARISON:  07/30/2017 CT. FINDINGS: Nasogastric tube tip projects at the gastric antrum level with side hole at the gastric body level. Prominent heart size. Gas-filled top-normal size transverse colon. IMPRESSION: Nasogastric tube tip projects at the gastric antrum level with side hole at the gastric body level. Electronically Signed   By: Genia Del M.D.   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 Acute on chronic respiratory failure with hypoxia patient is tolerating the wean we will continue to advance to wean as tolerated.  Patient's been doing fine  on pressure support as needed. 2. Lobar pneumonia treated with antibiotics we will continue to follow 3. Chronic conditions anoxic encephalopathy stable we will monitor 4. Acute renal failure labs are stable we will continue to follow along 5. Panhypopituitarism at baseline   I have personally seen and evaluated the patient, evaluated laboratory and imaging results, formulated the assessment and plan and placed orders. The Patient requires high complexity decision making for assessment and support.  Case was  discussed on Rounds with the Respiratory Therapy Staff  Allyne Gee, MD [facility] Pulmonary Critical Care Medicine Sleep Medicine

## 2017-08-02 DIAGNOSIS — J181 Lobar pneumonia, unspecified organism: Secondary | ICD-10-CM | POA: Diagnosis not present

## 2017-08-02 DIAGNOSIS — I2699 Other pulmonary embolism without acute cor pulmonale: Secondary | ICD-10-CM | POA: Diagnosis not present

## 2017-08-02 DIAGNOSIS — E230 Hypopituitarism: Secondary | ICD-10-CM | POA: Diagnosis not present

## 2017-08-02 DIAGNOSIS — N179 Acute kidney failure, unspecified: Secondary | ICD-10-CM | POA: Diagnosis not present

## 2017-08-02 DIAGNOSIS — J9621 Acute and chronic respiratory failure with hypoxia: Secondary | ICD-10-CM | POA: Diagnosis not present

## 2017-08-02 DIAGNOSIS — G931 Anoxic brain damage, not elsewhere classified: Secondary | ICD-10-CM | POA: Diagnosis not present

## 2017-08-02 LAB — PROTIME-INR
INR: 1.44
PROTHROMBIN TIME: 17.5 s — AB (ref 11.4–15.2)

## 2017-08-02 LAB — POTASSIUM: Potassium: 3.4 mmol/L — ABNORMAL LOW (ref 3.5–5.1)

## 2017-08-02 NOTE — Progress Notes (Signed)
Pulmonary [place]   PULMONARY SERVICE  PROGRESS NOTE  Date of Service: 08/02/2017  Andre Gallego  VWU:[id number]  DOB: April 26, 1980   DOA: 07/24/2017  Referring Physician: Merton Border, MD  HPI: Debra Sanford is a 37 y.o. female seen for follow up of Acute on Chronic Respiratory Failure.  Patient was able to do about 8 hours of pressure support wean was on 30% oxygen still having low-grade fevers  Medications: Reviewed on Rounds  Physical Exam:  Vitals: Temperature 100.9 pulse 102 respiratory 19 blood pressure 155/94 saturation 93%  Ventilator Settings mode of ventilation pressure support FiO2 30% on 423 pressure support 12 PEEP 5  . General: Comfortable at this time . Eyes: Grossly normal lids, irises & conjunctiva . ENT: grossly tongue is normal . Neck: no obvious mass . Cardiovascular: S1 S2 normal no gallop . Respiratory: Scattered rhonchi are noted bilaterally . Abdomen: soft . Skin: no rash seen on limited exam . Musculoskeletal: not rigid . Psychiatric:unable to assess . Neurologic: no seizure no involuntary movements         Lab Data:   Basic Metabolic Panel: Recent Labs  Lab 07/28/17 0829 07/30/17 0838 08/01/17 0525 08/01/17 2346  NA 145 146* 148*  --   K 3.3* 3.5 2.7* 3.4*  CL 102 100* 104  --   CO2 27 34* 32  --   GLUCOSE 115* 133* 173*  --   BUN 31* 56* 41*  --   CREATININE 1.52* 1.46* 0.89  --   CALCIUM 9.2 8.9 8.7*  --   MG  --   --  2.4  --     Liver Function Tests: Recent Labs  Lab 08/01/17 0525  AST 20  ALT 19  ALKPHOS 126  BILITOT 0.6  PROT 6.6  ALBUMIN 2.0*   No results for input(s): LIPASE, AMYLASE in the last 168 hours. No results for input(s): AMMONIA in the last 168 hours.  CBC: Recent Labs  Lab 07/28/17 0835 07/30/17 0838 08/01/17 0525  WBC 9.7 11.5* 11.4*  NEUTROABS 6.7  --   --   HGB 12.3 11.4* 9.7*  HCT 42.9 38.7 33.5*  MCV 84.3 81.3 81.9  PLT 539* 321  238    Cardiac Enzymes: No results for input(s): CKTOTAL, CKMB, CKMBINDEX, TROPONINI in the last 168 hours.  BNP (last 3 results) No results for input(s): BNP in the last 8760 hours.  ProBNP (last 3 results) No results for input(s): PROBNP in the last 8760 hours.  Radiological Exams: No results found.  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 Came chronic respiratory failure with hypoxia we will continue to advance the wean was able to complete 8 hours goal should be for about 12 hours continue pulmonary toilet supportive care 2. Lobar pneumonia treated and will continue to follow x-rays 3. Anoxic encephalopathy mostly unchanged 4. Pulmonary embolism treated we will follow along 5. Acute renal failure resolved trend labs 6. Pan hypo-pit stable at this time of monitor   I have personally seen and evaluated the patient, evaluated laboratory and imaging results, formulated the assessment and plan and placed orders. The Patient requires high complexity decision making for assessment and support.  Case was discussed on Rounds with the Respiratory  Therapy Staff  Allyne Gee, MD [facility] Pulmonary Critical Care Medicine Sleep Medicine

## 2017-08-03 DIAGNOSIS — N179 Acute kidney failure, unspecified: Secondary | ICD-10-CM | POA: Diagnosis not present

## 2017-08-03 DIAGNOSIS — J181 Lobar pneumonia, unspecified organism: Secondary | ICD-10-CM | POA: Diagnosis not present

## 2017-08-03 DIAGNOSIS — J9621 Acute and chronic respiratory failure with hypoxia: Secondary | ICD-10-CM | POA: Diagnosis not present

## 2017-08-03 DIAGNOSIS — G931 Anoxic brain damage, not elsewhere classified: Secondary | ICD-10-CM | POA: Diagnosis not present

## 2017-08-03 LAB — PROTIME-INR
INR: 1.53
Prothrombin Time: 18.3 seconds — ABNORMAL HIGH (ref 11.4–15.2)

## 2017-08-03 NOTE — Progress Notes (Signed)
Pulmonary [place]   PULMONARY SERVICE  PROGRESS NOTE  Date of Service: 08/03/2017  Debra Sanford  DVV:[id number]  DOB: 1980/09/19   DOA: 07/24/2017  Referring Physician: Merton Border, MD  HPI: Debra Sanford is a 37 y.o. female seen for follow up of Acute on Chronic Respiratory Failure.  Patient remains on pressure support mode at this time is been on 30% oxygen no distress is noted.  Patient's PEEP is at 5 pressure support 12  Medications: Reviewed on Rounds  Physical Exam:  Vitals: Temperature 100.1 pulse 91 respiratory rate 20 blood pressure 129/63 saturations 94%  Ventilator Settings mode of ventilation pressure support FiO2 30% tidal volume 500 pressure support 12/5  . General: Comfortable at this time . Eyes: Grossly normal lids, irises & conjunctiva . ENT: grossly tongue is normal . Neck: no obvious mass . Cardiovascular: S1 S2 normal no gallop . Respiratory: No rhonchi are noted at this time . Abdomen: soft . Skin: no rash seen on limited exam . Musculoskeletal: not rigid . Psychiatric:unable to assess . Neurologic: no seizure no involuntary movements         Lab Data:   Basic Metabolic Panel: Recent Labs  Lab 07/28/17 0829 07/30/17 0838 08/01/17 0525 08/01/17 2346  NA 145 146* 148*  --   K 3.3* 3.5 2.7* 3.4*  CL 102 100* 104  --   CO2 27 34* 32  --   GLUCOSE 115* 133* 173*  --   BUN 31* 56* 41*  --   CREATININE 1.52* 1.46* 0.89  --   CALCIUM 9.2 8.9 8.7*  --   MG  --   --  2.4  --     Liver Function Tests: Recent Labs  Lab 08/01/17 0525  AST 20  ALT 19  ALKPHOS 126  BILITOT 0.6  PROT 6.6  ALBUMIN 2.0*   No results for input(s): LIPASE, AMYLASE in the last 168 hours. No results for input(s): AMMONIA in the last 168 hours.  CBC: Recent Labs  Lab 07/28/17 0835 07/30/17 0838 08/01/17 0525  WBC 9.7 11.5* 11.4*  NEUTROABS 6.7  --   --   HGB 12.3 11.4* 9.7*  HCT 42.9 38.7 33.5*   MCV 84.3 81.3 81.9  PLT 539* 321 238    Cardiac Enzymes: No results for input(s): CKTOTAL, CKMB, CKMBINDEX, TROPONINI in the last 168 hours.  BNP (last 3 results) No results for input(s): BNP in the last 8760 hours.  ProBNP (last 3 results) No results for input(s): PROBNP in the last 8760 hours.  Radiological Exams: No results found.  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 Acute on chronic respiratory failure with hypoxia we will continue with weaning on pressure support she is tolerating it well so far we should be able to hopefully extend to 24 hours soon. 2. Lobar pneumonia treated with antibiotics we will continue to follow 3. Panhypopituitarism stable we will monitor 4. Acute renal failure clinically improved we will monitor labs 5. Chronic anoxic encephalopathy she is a little bit more awake today 6. Pulmonary embolism treated we will monitor   I have personally seen and evaluated the patient, evaluated laboratory and imaging results, formulated the assessment and plan and placed orders. The Patient  requires high complexity decision making for assessment and support.  Case was discussed on Rounds with the Respiratory Therapy Staff  Allyne Gee, MD [facility] Pulmonary Critical Care Medicine Sleep Medicine

## 2017-08-04 DIAGNOSIS — I2699 Other pulmonary embolism without acute cor pulmonale: Secondary | ICD-10-CM | POA: Diagnosis not present

## 2017-08-04 DIAGNOSIS — N179 Acute kidney failure, unspecified: Secondary | ICD-10-CM | POA: Diagnosis not present

## 2017-08-04 DIAGNOSIS — E230 Hypopituitarism: Secondary | ICD-10-CM | POA: Diagnosis not present

## 2017-08-04 DIAGNOSIS — G931 Anoxic brain damage, not elsewhere classified: Secondary | ICD-10-CM | POA: Diagnosis not present

## 2017-08-04 DIAGNOSIS — J9621 Acute and chronic respiratory failure with hypoxia: Secondary | ICD-10-CM | POA: Diagnosis not present

## 2017-08-04 DIAGNOSIS — J181 Lobar pneumonia, unspecified organism: Secondary | ICD-10-CM | POA: Diagnosis not present

## 2017-08-04 LAB — COMPREHENSIVE METABOLIC PANEL
ALBUMIN: 2.1 g/dL — AB (ref 3.5–5.0)
ALT: 16 U/L (ref 14–54)
ANION GAP: 10 (ref 5–15)
AST: 19 U/L (ref 15–41)
Alkaline Phosphatase: 80 U/L (ref 38–126)
BUN: 33 mg/dL — ABNORMAL HIGH (ref 6–20)
CHLORIDE: 106 mmol/L (ref 101–111)
CO2: 30 mmol/L (ref 22–32)
Calcium: 8.4 mg/dL — ABNORMAL LOW (ref 8.9–10.3)
Creatinine, Ser: 0.67 mg/dL (ref 0.44–1.00)
GFR calc non Af Amer: >60 mL/min (ref >60)
GLUCOSE: 174 mg/dL — AB (ref 65–99)
POTASSIUM: 3.3 mmol/L — AB (ref 3.5–5.1)
SODIUM: 146 mmol/L — AB (ref 135–145)
Total Bilirubin: 0.7 mg/dL (ref 0.3–1.2)
Total Protein: 6 g/dL — ABNORMAL LOW (ref 6.5–8.1)

## 2017-08-04 LAB — CBC
HEMATOCRIT: 32.1 % — AB (ref 36.0–46.0)
HEMOGLOBIN: 9.2 g/dL — AB (ref 12.0–15.0)
MCH: 24 pg — AB (ref 26.0–34.0)
MCHC: 28.7 g/dL — ABNORMAL LOW (ref 30.0–36.0)
MCV: 83.6 fL (ref 78.0–100.0)
Platelets: 222 10*3/uL (ref 150–400)
RBC: 3.84 MIL/uL — AB (ref 3.87–5.11)
RDW: 17.5 % — ABNORMAL HIGH (ref 11.5–15.5)
WBC: 13.1 10*3/uL — ABNORMAL HIGH (ref 4.0–10.5)

## 2017-08-04 LAB — TRIGLYCERIDES: Triglycerides: 184 mg/dL — ABNORMAL HIGH (ref <150)

## 2017-08-04 LAB — PROTIME-INR
INR: 1.53
INR: 1.66
PROTHROMBIN TIME: 18.3 s — AB (ref 11.4–15.2)
Prothrombin Time: 19.5 seconds — ABNORMAL HIGH (ref 11.4–15.2)

## 2017-08-04 LAB — MAGNESIUM: MAGNESIUM: 2.1 mg/dL (ref 1.7–2.4)

## 2017-08-04 NOTE — Progress Notes (Signed)
Pulmonary [place]   PULMONARY SERVICE  PROGRESS NOTE  Date of Service: 08/04/2017  [name]  ZCH:[id number]  DOB: 1980/07/27   DOA: 07/24/2017  Referring Physician: Merton Border, MD  HPI: Debra Sanford is a 37 y.o. female seen for follow up of Acute on Chronic Respiratory Failure.  Patient did about 3 hours on T collar right now is resting on pressure support will try to resume the weaning again on T collar  Medications: Reviewed on Rounds  Physical Exam:  Vitals: Temperature 100.0 pulse 88 respiratory rate 21 blood pressure 157/94 saturations 97%  Ventilator Settings mode of ventilation pressure support FiO2 20% per support 12/5  . General: Comfortable at this time . Eyes: Grossly normal lids, irises & conjunctiva . ENT: grossly tongue is normal . Neck: no obvious mass . Cardiovascular: S1 S2 normal no gallop . Respiratory: No rhonchi or rales noted . Abdomen: soft . Skin: no rash seen on limited exam . Musculoskeletal: not rigid . Psychiatric:unable to assess . Neurologic: no seizure no involuntary movements         Lab Data:   Basic Metabolic Panel: Recent Labs  Lab 07/30/17 0838 08/01/17 0525 08/01/17 2346 08/03/17 2358  NA 146* 148*  --  146*  K 3.5 2.7* 3.4* 3.3*  CL 100* 104  --  106  CO2 34* 32  --  30  GLUCOSE 133* 173*  --  174*  BUN 56* 41*  --  33*  CREATININE 1.46* 0.89  --  0.67  CALCIUM 8.9 8.7*  --  8.4*  MG  --  2.4  --  2.1    Liver Function Tests: Recent Labs  Lab 08/01/17 0525 08/03/17 2358  AST 20 19  ALT 19 16  ALKPHOS 126 80  BILITOT 0.6 0.7  PROT 6.6 6.0*  ALBUMIN 2.0* 2.1*   No results for input(s): LIPASE, AMYLASE in the last 168 hours. No results for input(s): AMMONIA in the last 168 hours.  CBC: Recent Labs  Lab 07/30/17 0838 08/01/17 0525 08/03/17 2358  WBC 11.5* 11.4* 13.1*  HGB 11.4* 9.7* 9.2*  HCT 38.7 33.5* 32.1*  MCV 81.3 81.9 83.6  PLT 321  238 222    Cardiac Enzymes: No results for input(s): CKTOTAL, CKMB, CKMBINDEX, TROPONINI in the last 168 hours.  BNP (last 3 results) No results for input(s): BNP in the last 8760 hours.  ProBNP (last 3 results) No results for input(s): PROBNP in the last 8760 hours.  Radiological Exams: No results found.  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 Acute on chronic respiratory failure with hypoxia we will continue with pressure support titrate as tolerated try to resume on T collar as already indicated above. 2. Lobar pneumonia treated clinically we will follow along 3. Acute renal failure prognosis guarded we will continue with supportive care 4. Anoxic encephalopathy she is a little bit more awake 5. Pulmonary embolism treated 6. Panhypopituitarism at baseline we will continue to follow   I have personally seen and evaluated the patient, evaluated laboratory and imaging results, formulated the assessment and plan and placed orders. The Patient requires high complexity decision making for assessment and support.  Case was discussed on Rounds with the Respiratory Therapy  Staff  Allyne Gee, MD [facility] [place] Pulmonary Critical Care Medicine Sleep Medicine

## 2017-08-05 DIAGNOSIS — G931 Anoxic brain damage, not elsewhere classified: Secondary | ICD-10-CM | POA: Diagnosis not present

## 2017-08-05 DIAGNOSIS — J9621 Acute and chronic respiratory failure with hypoxia: Secondary | ICD-10-CM | POA: Diagnosis not present

## 2017-08-05 DIAGNOSIS — J181 Lobar pneumonia, unspecified organism: Secondary | ICD-10-CM | POA: Diagnosis not present

## 2017-08-05 DIAGNOSIS — N179 Acute kidney failure, unspecified: Secondary | ICD-10-CM | POA: Diagnosis not present

## 2017-08-05 LAB — VANCOMYCIN, TROUGH: Vancomycin Tr: 28 ug/mL (ref 15–20)

## 2017-08-05 LAB — CBC
HCT: 33.3 % — ABNORMAL LOW (ref 36.0–46.0)
HEMOGLOBIN: 9.5 g/dL — AB (ref 12.0–15.0)
MCH: 23.8 pg — AB (ref 26.0–34.0)
MCHC: 28.5 g/dL — ABNORMAL LOW (ref 30.0–36.0)
MCV: 83.3 fL (ref 78.0–100.0)
Platelets: 264 10*3/uL (ref 150–400)
RBC: 4 MIL/uL (ref 3.87–5.11)
RDW: 17.3 % — ABNORMAL HIGH (ref 11.5–15.5)
WBC: 13.8 10*3/uL — ABNORMAL HIGH (ref 4.0–10.5)

## 2017-08-05 LAB — BASIC METABOLIC PANEL
ANION GAP: 10 (ref 5–15)
BUN: 33 mg/dL — ABNORMAL HIGH (ref 6–20)
CHLORIDE: 110 mmol/L (ref 101–111)
CO2: 30 mmol/L (ref 22–32)
Calcium: 8.8 mg/dL — ABNORMAL LOW (ref 8.9–10.3)
Creatinine, Ser: 0.63 mg/dL (ref 0.44–1.00)
GFR calc Af Amer: >60 mL/min (ref >60)
GFR calc non Af Amer: >60 mL/min (ref >60)
Glucose, Bld: 198 mg/dL — ABNORMAL HIGH (ref 65–99)
POTASSIUM: 2.9 mmol/L — AB (ref 3.5–5.1)
Sodium: 150 mmol/L — ABNORMAL HIGH (ref 135–145)

## 2017-08-05 LAB — PROTIME-INR
INR: 1.91
Prothrombin Time: 21.7 seconds — ABNORMAL HIGH (ref 11.4–15.2)

## 2017-08-05 NOTE — Progress Notes (Signed)
Pulmonary [place]   PULMONARY SERVICE  PROGRESS NOTE  Date of Service: 08/05/2017  Debra Sanford  [id number]  DOB: 28-Aug-1980   DOA: 07/24/2017  Referring Physician: Merton Border, MD  HPI: Debra Sanford is a 37 y.o. female seen for follow up of Acute on Chronic Respiratory Failure.  Patient right now is on T collar 28% FiO2 has been tolerating the wean so far  Medications: Reviewed on Rounds  Physical Exam:  Vitals: Temperature 99.5 pulse 89 respiratory rate 20 blood pressure 144/66 saturation 95%  Ventilator Settings off of the ventilator on T collar  . General: Comfortable at this time . Eyes: Grossly normal lids, irises & conjunctiva . ENT: grossly tongue is normal . Neck: no obvious mass . Cardiovascular: S1 S2 normal no gallop . Respiratory: Coarse breath sounds no rhonchi . Abdomen: soft . Skin: no rash seen on limited exam . Musculoskeletal: not rigid . Psychiatric:unable to assess . Neurologic: no seizure no involuntary movements         Lab Data:   Basic Metabolic Panel: Recent Labs  Lab 07/30/17 0838 08/01/17 0525 08/01/17 2346 08/03/17 2358 08/05/17 0015  NA 146* 148*  --  146* 150*  K 3.5 2.7* 3.4* 3.3* 2.9*  CL 100* 104  --  106 110  CO2 34* 32  --  30 30  GLUCOSE 133* 173*  --  174* 198*  BUN 56* 41*  --  33* 33*  CREATININE 1.46* 0.89  --  0.67 0.63  CALCIUM 8.9 8.7*  --  8.4* 8.8*  MG  --  2.4  --  2.1  --     Liver Function Tests: Recent Labs  Lab 08/01/17 0525 08/03/17 2358  Sanford 20 19  ALT 19 16  ALKPHOS 126 80  BILITOT 0.6 0.7  PROT 6.6 6.0*  ALBUMIN 2.0* 2.1*   No results for input(s): LIPASE, AMYLASE in the last 168 hours. No results for input(s): AMMONIA in the last 168 hours.  CBC: Recent Labs  Lab 07/30/17 0838 08/01/17 0525 08/03/17 2358 08/05/17 0015  WBC 11.5* 11.4* 13.1* 13.8*  HGB 11.4* 9.7* 9.2* 9.5*  HCT 38.7 33.5* 32.1* 33.3*  MCV 81.3 81.9  83.6 83.3  PLT 321 238 222 264    Cardiac Enzymes: No results for input(s): CKTOTAL, CKMB, CKMBINDEX, TROPONINI in the last 168 hours.  BNP (last 3 results) No results for input(s): BNP in the last 8760 hours.  ProBNP (last 3 results) No results for input(s): PROBNP in the last 8760 hours.  Radiological Exams: No results found.  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 Acute on chronic respiratory failure with hypoxia we will continue T collar trials continue pulmonary toilet supportive care overall patient prognosis is guarded. 2. Lobar pneumonia treated with antibiotics we will continue to follow 3. Acute renal failure labs are stable 4. Chronic anoxic encephalopathy she has eyes open nonverbal 5. Pulmonary embolism grossly unchanged 6. Panhypopituitarism continue with supportive care   I have personally seen and evaluated the patient, evaluated laboratory and imaging results, formulated the assessment and plan and placed orders. The Patient requires high complexity decision making for assessment and support.  Case was discussed on Rounds with the Respiratory Therapy Staff  Allyne Gee, MD [facility] Pulmonary Critical Care Medicine Sleep Medicine

## 2017-08-06 DIAGNOSIS — J181 Lobar pneumonia, unspecified organism: Secondary | ICD-10-CM | POA: Diagnosis not present

## 2017-08-06 DIAGNOSIS — G931 Anoxic brain damage, not elsewhere classified: Secondary | ICD-10-CM | POA: Diagnosis not present

## 2017-08-06 DIAGNOSIS — J9621 Acute and chronic respiratory failure with hypoxia: Secondary | ICD-10-CM | POA: Diagnosis not present

## 2017-08-06 DIAGNOSIS — N179 Acute kidney failure, unspecified: Secondary | ICD-10-CM | POA: Diagnosis not present

## 2017-08-06 LAB — PROTIME-INR
INR: 2.18
Prothrombin Time: 24 seconds — ABNORMAL HIGH (ref 11.4–15.2)

## 2017-08-06 LAB — VANCOMYCIN, TROUGH: VANCOMYCIN TR: 4 ug/mL — AB (ref 15–20)

## 2017-08-06 NOTE — Progress Notes (Signed)
Pulmonary [place]   PULMONARY SERVICE  PROGRESS NOTE  Date of Service: 08/06/2017  [name]  [id number]:[id number]  DOB: April 09, 1980   DOA: 07/24/2017  Referring Physician: Merton Border, MD  HPI: Debra Sanford is a 37 y.o. female seen for follow up of Acute on Chronic Respiratory Failure.  Patient is weaning on T collar is doing fairly well.  Currently is on 28% FiO2  Medications: Reviewed on Rounds  Physical Exam:  Vitals: Temperature 99.9 pulse 95 respiratory rate 16 blood pressure 149/88 saturations 95%  Ventilator Settings on T collar FiO2 20%  . General: Comfortable at this time . Eyes: Grossly normal lids, irises & conjunctiva . ENT: grossly tongue is normal . Neck: no obvious mass . Cardiovascular: S1 S2 normal no gallop . Respiratory: No rhonchi expansion is equal . Abdomen: soft . Skin: no rash seen on limited exam . Musculoskeletal: not rigid . Psychiatric:unable to assess . Neurologic: no seizure no involuntary movements         Lab Data:   Basic Metabolic Panel: Recent Labs  Lab 08/01/17 0525 08/01/17 2346 08/03/17 2358 08/05/17 0015  NA 148*  --  146* 150*  K 2.7* 3.4* 3.3* 2.9*  CL 104  --  106 110  CO2 32  --  30 30  GLUCOSE 173*  --  174* 198*  BUN 41*  --  33* 33*  CREATININE 0.89  --  0.67 0.63  CALCIUM 8.7*  --  8.4* 8.8*  MG 2.4  --  2.1  --     Liver Function Tests: Recent Labs  Lab 08/01/17 0525 08/03/17 2358  AST 20 19  ALT 19 16  ALKPHOS 126 80  BILITOT 0.6 0.7  PROT 6.6 6.0*  ALBUMIN 2.0* 2.1*   No results for input(s): LIPASE, AMYLASE in the last 168 hours. No results for input(s): AMMONIA in the last 168 hours.  CBC: Recent Labs  Lab 08/01/17 0525 08/03/17 2358 08/05/17 0015  WBC 11.4* 13.1* 13.8*  HGB 9.7* 9.2* 9.5*  HCT 33.5* 32.1* 33.3*  MCV 81.9 83.6 83.3  PLT 238 222 264    Cardiac Enzymes: No results for input(s): CKTOTAL, CKMB, CKMBINDEX,  TROPONINI in the last 168 hours.  BNP (last 3 results) No results for input(s): BNP in the last 8760 hours.  ProBNP (last 3 results) No results for input(s): PROBNP in the last 8760 hours.  Radiological Exams: No results found.  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)   Acute renal failure (ARF) (HCC)   Chronic anoxic encephalopathy (HCC)   Pulmonary embolism ([place])   1. Acute on chronic respiratory failure with hypoxia continue to wean on T collar goal of 16 hours continue pulmonary toilet supportive care 2. Lobar pneumonia treated with antibiotics continue to show improvement 3. Panhypopituitarism at baseline 4. Acute renal failure monitor labs 5. Anoxic encephalopathy she is stable 6. Pulmonary embolism treated   I have personally seen and evaluated the patient, evaluated laboratory and imaging results, formulated the assessment and plan and placed orders. The Patient requires high complexity decision making for assessment and support.  Case was discussed on Rounds with the Respiratory Therapy Staff  Allyne Gee, MD [facility] Pulmonary Critical Care Medicine Sleep Medicine

## 2017-08-07 DIAGNOSIS — N179 Acute kidney failure, unspecified: Secondary | ICD-10-CM | POA: Diagnosis not present

## 2017-08-07 DIAGNOSIS — J9621 Acute and chronic respiratory failure with hypoxia: Secondary | ICD-10-CM | POA: Diagnosis not present

## 2017-08-07 DIAGNOSIS — G931 Anoxic brain damage, not elsewhere classified: Secondary | ICD-10-CM | POA: Diagnosis not present

## 2017-08-07 DIAGNOSIS — J181 Lobar pneumonia, unspecified organism: Secondary | ICD-10-CM | POA: Diagnosis not present

## 2017-08-07 LAB — COMPREHENSIVE METABOLIC PANEL
ALBUMIN: 2.4 g/dL — AB (ref 3.5–5.0)
ALK PHOS: 66 U/L (ref 38–126)
ALT: 13 U/L — ABNORMAL LOW (ref 14–54)
AST: 17 U/L (ref 15–41)
Anion gap: 7 (ref 5–15)
BILIRUBIN TOTAL: 0.7 mg/dL (ref 0.3–1.2)
BUN: 28 mg/dL — AB (ref 6–20)
CALCIUM: 8.6 mg/dL — AB (ref 8.9–10.3)
CO2: 29 mmol/L (ref 22–32)
Chloride: 112 mmol/L — ABNORMAL HIGH (ref 101–111)
Creatinine, Ser: 0.6 mg/dL (ref 0.44–1.00)
GFR calc Af Amer: >60 mL/min (ref >60)
GFR calc non Af Amer: >60 mL/min (ref >60)
GLUCOSE: 291 mg/dL — AB (ref 65–99)
Potassium: 3.3 mmol/L — ABNORMAL LOW (ref 3.5–5.1)
Sodium: 148 mmol/L — ABNORMAL HIGH (ref 135–145)
TOTAL PROTEIN: 6 g/dL — AB (ref 6.5–8.1)

## 2017-08-07 LAB — MAGNESIUM: Magnesium: 2 mg/dL (ref 1.7–2.4)

## 2017-08-07 LAB — TRIGLYCERIDES: Triglycerides: 254 mg/dL — ABNORMAL HIGH (ref <150)

## 2017-08-07 LAB — PROTIME-INR
INR: 2.39
Prothrombin Time: 25.8 seconds — ABNORMAL HIGH (ref 11.4–15.2)

## 2017-08-07 NOTE — Progress Notes (Signed)
Pulmonary [place]   PULMONARY SERVICE  PROGRESS NOTE  Date of Service: 08/07/2017  Debra Sanford  [id number]  DOB: [birth year]   DOA: 07/24/2017  Referring Physician: Merton Border, MD  HPI: Debra Sanford is a 37 y.o. female seen for follow up of Acute on Chronic Respiratory Failure.  Currently is on T collar has been on 28% oxygen.  The goal is to wean for about 20 hours  Medications: Reviewed on Rounds  Physical Exam:  Vitals: Temperature 98.6 pulse 91 respiratory rate 14 blood pressure 134/83 saturations 96%  Ventilator Settings off of the ventilator on T collar  . General: Comfortable at this time . Eyes: Grossly normal lids, irises & conjunctiva . ENT: grossly tongue is normal . Neck: no obvious mass . Cardiovascular: S1 S2 normal no gallop . Respiratory: No rhonchi . Abdomen: soft . Skin: no rash seen on limited exam . Musculoskeletal: not rigid . Psychiatric:unable to assess . Neurologic: no seizure no involuntary movements         Lab Data:   Basic Metabolic Panel: Recent Labs  Lab 08/01/17 0525 08/01/17 2346 08/03/17 2358 08/05/17 0015 08/07/17 0610  NA 148*  --  146* 150* 148*  K 2.7* 3.4* 3.3* 2.9* 3.3*  CL 104  --  106 110 112*  CO2 32  --  30 30 29   GLUCOSE 173*  --  174* 198* 291*  BUN 41*  --  33* 33* 28*  CREATININE 0.89  --  0.67 0.63 0.60  CALCIUM 8.7*  --  8.4* 8.8* 8.6*  MG 2.4  --  2.1  --  2.0    Liver Function Tests: Recent Labs  Lab 08/01/17 0525 08/03/17 2358 08/07/17 0610  AST 20 19 17   ALT 19 16 13*  ALKPHOS 126 80 66  BILITOT 0.6 0.7 0.7  PROT 6.6 6.0* 6.0*  ALBUMIN 2.0* 2.1* 2.4*   No results for input(s): LIPASE, AMYLASE in the last 168 hours. No results for input(s): AMMONIA in the last 168 hours.  CBC: Recent Labs  Lab 08/01/17 0525 08/03/17 2358 08/05/17 0015  WBC 11.4* 13.1* 13.8*  HGB 9.7* 9.2* 9.5*  HCT 33.5* 32.1* 33.3*  MCV 81.9 83.6  83.3  PLT 238 222 264    Cardiac Enzymes: No results for input(s): CKTOTAL, CKMB, CKMBINDEX, TROPONINI in the last 168 hours.  BNP (last 3 results) No results for input(s): BNP in the last 8760 hours.  ProBNP (last 3 results) No results for input(s): PROBNP in the last 8760 hours.  Radiological Exams: No results found.  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 Acute on chronic respiratory failure with hypoxia we will continue to wean as ordered.  Goal is 20 hours as noted above. 2. Lobar pneumonia treated with antibiotics we will follow 3. Cannot take potassium at baseline we will monitor 4. Acute renal failure we will continue with supportive care 5. Chronic anoxic encephalopathy unchanged 6. Pulmonary embolism treated we will monitor   I have personally seen and evaluated the patient, evaluated laboratory and imaging results, formulated the assessment and plan and placed orders. The Patient requires high complexity decision making for assessment and support.  Case was discussed on Rounds with the Respiratory Therapy  Staff  Allyne Gee, MD [facility] Pulmonary Critical Care Medicine Sleep Medicine

## 2017-08-08 DIAGNOSIS — N179 Acute kidney failure, unspecified: Secondary | ICD-10-CM | POA: Diagnosis not present

## 2017-08-08 DIAGNOSIS — G931 Anoxic brain damage, not elsewhere classified: Secondary | ICD-10-CM | POA: Diagnosis not present

## 2017-08-08 DIAGNOSIS — J181 Lobar pneumonia, unspecified organism: Secondary | ICD-10-CM | POA: Diagnosis not present

## 2017-08-08 DIAGNOSIS — J9621 Acute and chronic respiratory failure with hypoxia: Secondary | ICD-10-CM | POA: Diagnosis not present

## 2017-08-08 LAB — PROTIME-INR
INR: 2.73
Prothrombin Time: 28.7 seconds — ABNORMAL HIGH (ref 11.4–15.2)

## 2017-08-08 LAB — POTASSIUM: Potassium: 3.3 mmol/L — ABNORMAL LOW (ref 3.5–5.1)

## 2017-08-08 NOTE — Progress Notes (Signed)
Pulmonary [place]   PULMONARY SERVICE  PROGRESS NOTE  Date of Service: 08/08/2017  Debra Sanford  [id number]  DOB: 02-Jun-1980   DOA: 07/24/2017  Referring Physician: Merton Border, MD  HPI: Debra Sanford is a 37 y.o. female seen for follow up of Acute on Chronic Respiratory Failure.  Afebrile comfortable without distress at this time.  Patient's been on 28% oxygen.  Goal is for about 24 hours  Medications: Reviewed on Rounds  Physical Exam:  Vitals: Temperature 97.1 pulse 70 respiratory rate 20 blood pressure 152/76 saturations 98%  Ventilator Settings on T collar will be continued  . General: Comfortable at this time . Eyes: Grossly normal lids, irises & conjunctiva . ENT: grossly tongue is normal . Neck: no obvious mass . Cardiovascular: S1 S2 normal no gallop . Respiratory: No rhonchi . Abdomen: soft . Skin: no rash seen on limited exam . Musculoskeletal: not rigid . Psychiatric:unable to assess . Neurologic: no seizure no involuntary movements         Lab Data:   Basic Metabolic Panel: Recent Labs  Lab 08/01/17 2346 08/03/17 2358 08/05/17 0015 08/07/17 0610 08/08/17 0608  NA  --  146* 150* 148*  --   K 3.4* 3.3* 2.9* 3.3* 3.3*  CL  --  106 110 112*  --   CO2  --  30 30 29   --   GLUCOSE  --  174* 198* 291*  --   BUN  --  33* 33* 28*  --   CREATININE  --  0.67 0.63 0.60  --   CALCIUM  --  8.4* 8.8* 8.6*  --   MG  --  2.1  --  2.0  --     Liver Function Tests: Recent Labs  Lab 08/03/17 2358 08/07/17 0610  AST 19 17  ALT 16 13*  ALKPHOS 80 66  BILITOT 0.7 0.7  PROT 6.0* 6.0*  ALBUMIN 2.1* 2.4*   No results for input(s): LIPASE, AMYLASE in the last 168 hours. No results for input(s): AMMONIA in the last 168 hours.  CBC: Recent Labs  Lab 08/03/17 2358 08/05/17 0015  WBC 13.1* 13.8*  HGB 9.2* 9.5*  HCT 32.1* 33.3*  MCV 83.6 83.3  PLT 222 264    Cardiac Enzymes: No results  for input(s): CKTOTAL, CKMB, CKMBINDEX, TROPONINI in the last 168 hours.  BNP (last 3 results) No results for input(s): BNP in the last 8760 hours.  ProBNP (last 3 results) No results for input(s): PROBNP in the last 8760 hours.  Radiological Exams: No results found.  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 Acute on chronic respiratory failure with hypoxia we will continue weaning on T collar goal 24 hours tolerating it well so far 2. Lobar pneumonia treated with antibiotics we will continue to follow 3. Panhypopituitarism at baseline 4. Acute renal failure clinically improving 5. Chronically anoxic encephalopathy stable we will monitor 6. Pulmonary embolism treated   I have personally seen and evaluated the patient, evaluated laboratory and imaging results, formulated the assessment and plan and placed orders. The Patient requires high complexity decision making for assessment and support.  Case was discussed on Rounds with the Respiratory Therapy Staff  Allyne Gee, MD [facility]  Pulmonary Critical Care Medicine Sleep Medicine

## 2017-08-09 DIAGNOSIS — N179 Acute kidney failure, unspecified: Secondary | ICD-10-CM | POA: Diagnosis not present

## 2017-08-09 DIAGNOSIS — I2699 Other pulmonary embolism without acute cor pulmonale: Secondary | ICD-10-CM | POA: Diagnosis not present

## 2017-08-09 DIAGNOSIS — J9621 Acute and chronic respiratory failure with hypoxia: Secondary | ICD-10-CM | POA: Diagnosis not present

## 2017-08-09 DIAGNOSIS — J181 Lobar pneumonia, unspecified organism: Secondary | ICD-10-CM | POA: Diagnosis not present

## 2017-08-09 DIAGNOSIS — G931 Anoxic brain damage, not elsewhere classified: Secondary | ICD-10-CM | POA: Diagnosis not present

## 2017-08-09 DIAGNOSIS — E230 Hypopituitarism: Secondary | ICD-10-CM | POA: Diagnosis not present

## 2017-08-09 LAB — PROTIME-INR
INR: 2.81
Prothrombin Time: 29.4 seconds — ABNORMAL HIGH (ref 11.4–15.2)

## 2017-08-09 LAB — POTASSIUM: POTASSIUM: 3.2 mmol/L — AB (ref 3.5–5.1)

## 2017-08-09 NOTE — Progress Notes (Signed)
Pulmonary [place]   PULMONARY SERVICE  PROGRESS NOTE  Date of Service: 08/09/2017  Debra Sanford  [id number]  DOB: 1980-11-13   DOA: 07/24/2017  Referring Physician: Merton Border, MD  HPI: Debra Sanford is a 37 y.o. female seen for follow up of Acute on Chronic Respiratory Failure.  Patient currently is off the ventilator on T collar was able to complete 17 hours yesterday today the goal is for 24 hours off the ventilator.  Medications: Reviewed on Rounds  Physical Exam:  Vitals: Temperature 98.4 pulse 76 respiratory rate 21 blood pressure 121/82 saturations 97%  Ventilator Settings currently off of the ventilator on T collar  . General: Comfortable at this time . Eyes: Grossly normal lids, irises & conjunctiva . ENT: grossly tongue is normal . Neck: no obvious mass . Cardiovascular: S1 S2 normal no gallop . Respiratory: Good air entry no rhonchi . Abdomen: soft . Skin: no rash seen on limited exam . Musculoskeletal: not rigid . Psychiatric:unable to assess . Neurologic: no seizure no involuntary movements         Lab Data:   Basic Metabolic Panel: Recent Labs  Lab 08/03/17 2358 08/05/17 0015 08/07/17 0610 08/08/17 0608 08/09/17 0620  NA 146* 150* 148*  --   --   K 3.3* 2.9* 3.3* 3.3* 3.2*  CL 106 110 112*  --   --   CO2 30 30 29   --   --   GLUCOSE 174* 198* 291*  --   --   BUN 33* 33* 28*  --   --   CREATININE 0.67 0.63 0.60  --   --   CALCIUM 8.4* 8.8* 8.6*  --   --   MG 2.1  --  2.0  --   --     Liver Function Tests: Recent Labs  Lab 08/03/17 2358 08/07/17 0610  AST 19 17  ALT 16 13*  ALKPHOS 80 66  BILITOT 0.7 0.7  PROT 6.0* 6.0*  ALBUMIN 2.1* 2.4*   No results for input(s): LIPASE, AMYLASE in the last 168 hours. No results for input(s): AMMONIA in the last 168 hours.  CBC: Recent Labs  Lab 08/03/17 2358 08/05/17 0015  WBC 13.1* 13.8*  HGB 9.2* 9.5*  HCT 32.1* 33.3*  MCV  83.6 83.3  PLT 222 264    Cardiac Enzymes: No results for input(s): CKTOTAL, CKMB, CKMBINDEX, TROPONINI in the last 168 hours.  BNP (last 3 results) No results for input(s): BNP in the last 8760 hours.  ProBNP (last 3 results) No results for input(s): PROBNP in the last 8760 hours.  Radiological Exams: No results found.  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)   Acute renal failure (ARF) (HCC)   Chronic anoxic encephalopathy (HCC)   Pulmonary embolism ([place])   1. Acute on chronic respiratory failure with hypoxia we will continue with weaning on T collar continue secretion management pulmonary toilet 2. Lobar pneumonia treated with antibiotics we will continue to follow 3. Panhypopituitarism at baseline 4. Chronic anoxic encephalopathy improving a little bit we will continue to follow 5. Pulmonary embolism treated 6. Acute renal failure follow labs   I have personally seen and evaluated the patient, evaluated laboratory and imaging results, formulated the assessment and plan and placed orders. The Patient requires high complexity decision making for assessment and support.  Case was  discussed on Rounds with the Respiratory Therapy Staff  Allyne Gee, MD [facility] Pulmonary Critical Care Medicine Sleep Medicine

## 2017-08-10 DIAGNOSIS — J9621 Acute and chronic respiratory failure with hypoxia: Secondary | ICD-10-CM | POA: Diagnosis not present

## 2017-08-10 DIAGNOSIS — N179 Acute kidney failure, unspecified: Secondary | ICD-10-CM | POA: Diagnosis not present

## 2017-08-10 DIAGNOSIS — J181 Lobar pneumonia, unspecified organism: Secondary | ICD-10-CM | POA: Diagnosis not present

## 2017-08-10 DIAGNOSIS — G931 Anoxic brain damage, not elsewhere classified: Secondary | ICD-10-CM | POA: Diagnosis not present

## 2017-08-10 LAB — COMPREHENSIVE METABOLIC PANEL
ALBUMIN: 2.5 g/dL — AB (ref 3.5–5.0)
ALK PHOS: 73 U/L (ref 38–126)
ALT: 13 U/L — ABNORMAL LOW (ref 14–54)
AST: 16 U/L (ref 15–41)
Anion gap: 9 (ref 5–15)
BILIRUBIN TOTAL: 0.6 mg/dL (ref 0.3–1.2)
BUN: 24 mg/dL — AB (ref 6–20)
CALCIUM: 9 mg/dL (ref 8.9–10.3)
CO2: 28 mmol/L (ref 22–32)
Chloride: 99 mmol/L — ABNORMAL LOW (ref 101–111)
Creatinine, Ser: 0.45 mg/dL (ref 0.44–1.00)
GFR calc Af Amer: >60 mL/min (ref >60)
GFR calc non Af Amer: >60 mL/min (ref >60)
GLUCOSE: 338 mg/dL — AB (ref 65–99)
Potassium: 3.4 mmol/L — ABNORMAL LOW (ref 3.5–5.1)
Sodium: 136 mmol/L (ref 135–145)
TOTAL PROTEIN: 5.7 g/dL — AB (ref 6.5–8.1)

## 2017-08-10 LAB — BASIC METABOLIC PANEL
Anion gap: 11 (ref 5–15)
BUN: 24 mg/dL — AB (ref 6–20)
CALCIUM: 9.3 mg/dL (ref 8.9–10.3)
CO2: 26 mmol/L (ref 22–32)
CREATININE: 0.44 mg/dL (ref 0.44–1.00)
Chloride: 100 mmol/L — ABNORMAL LOW (ref 101–111)
GFR calc Af Amer: >60 mL/min (ref >60)
GLUCOSE: 341 mg/dL — AB (ref 65–99)
Potassium: 3.4 mmol/L — ABNORMAL LOW (ref 3.5–5.1)
Sodium: 137 mmol/L (ref 135–145)

## 2017-08-10 LAB — TRIGLYCERIDES: Triglycerides: 178 mg/dL — ABNORMAL HIGH (ref <150)

## 2017-08-10 LAB — CBC
HCT: 38.2 % (ref 36.0–46.0)
Hemoglobin: 11.3 g/dL — ABNORMAL LOW (ref 12.0–15.0)
MCH: 23.7 pg — AB (ref 26.0–34.0)
MCHC: 29.6 g/dL — ABNORMAL LOW (ref 30.0–36.0)
MCV: 80.3 fL (ref 78.0–100.0)
PLATELETS: 448 10*3/uL — AB (ref 150–400)
RBC: 4.76 MIL/uL (ref 3.87–5.11)
RDW: 18.4 % — AB (ref 11.5–15.5)
WBC: 17.6 10*3/uL — AB (ref 4.0–10.5)

## 2017-08-10 LAB — PROTIME-INR
INR: 2.89
Prothrombin Time: 30 seconds — ABNORMAL HIGH (ref 11.4–15.2)

## 2017-08-10 LAB — MAGNESIUM: Magnesium: 2 mg/dL (ref 1.7–2.4)

## 2017-08-10 NOTE — Progress Notes (Signed)
Pulmonary [place]   PULMONARY SERVICE  PROGRESS NOTE  Date of Service: 08/10/2017  Debra Sanford  [id number]  DOB: [birth year]   DOA: 07/24/2017  Referring Physician: Merton Border, MD  HPI: Debra Sanford is a 37 y.o. female seen for follow up of Acute on Chronic Respiratory Failure.  Patient is comfortable without distress weaning on T collar goal was for 24 hours has been tolerating it fairly well  Medications: Reviewed on Rounds  Physical Exam:  Vitals: Temperature 97.0 pulse 74 respiratory rate 15 blood pressure 155/85 saturations 100%  Ventilator Settings currently is on T collar  . General: Comfortable at this time . Eyes: Grossly normal lids, irises & conjunctiva . ENT: grossly tongue is normal . Neck: no obvious mass . Cardiovascular: S1 S2 normal no gallop . Respiratory: No rhonchi or rales . Abdomen: soft . Skin: no rash seen on limited exam . Musculoskeletal: not rigid . Psychiatric:unable to assess . Neurologic: no seizure no involuntary movements         Lab Data:   Basic Metabolic Panel: Recent Labs  Lab 08/03/17 2358 08/05/17 0015 08/07/17 0610 08/08/17 7425 08/09/17 0620 08/10/17 0449 08/10/17 1102  NA 146* 150* 148*  --   --  136 137  K 3.3* 2.9* 3.3* 3.3* 3.2* 3.4* 3.4*  CL 106 110 112*  --   --  99* 100*  CO2 30 30 29   --   --  28 26  GLUCOSE 174* 198* 291*  --   --  338* 341*  BUN 33* 33* 28*  --   --  24* 24*  CREATININE 0.67 0.63 0.60  --   --  0.45 0.44  CALCIUM 8.4* 8.8* 8.6*  --   --  9.0 9.3  MG 2.1  --  2.0  --   --  2.0  --     Liver Function Tests: Recent Labs  Lab 08/03/17 2358 08/07/17 0610 08/10/17 0449  AST 19 17 16   ALT 16 13* 13*  ALKPHOS 80 66 73  BILITOT 0.7 0.7 0.6  PROT 6.0* 6.0* 5.7*  ALBUMIN 2.1* 2.4* 2.5*   No results for input(s): LIPASE, AMYLASE in the last 168 hours. No results for input(s): AMMONIA in the last 168 hours.  CBC: Recent  Labs  Lab 08/03/17 2358 08/05/17 0015 08/10/17 1102  WBC 13.1* 13.8* 17.6*  HGB 9.2* 9.5* 11.3*  HCT 32.1* 33.3* 38.2  MCV 83.6 83.3 80.3  PLT 222 264 448*    Cardiac Enzymes: No results for input(s): CKTOTAL, CKMB, CKMBINDEX, TROPONINI in the last 168 hours.  BNP (last 3 results) No results for input(s): BNP in the last 8760 hours.  ProBNP (last 3 results) No results for input(s): PROBNP in the last 8760 hours.  Radiological Exams: No results found.  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 Acute on chronic respiratory failure with hypoxia we will continue weaning on T collar continue pulmonary toilet supportive care doing well. 2. Lobar pneumonia treated we will continue pulmonary toilet 3. Acute renal failure labs are improving will follow 4. Toxic encephalopathy monitor closely we will continue supportive care 5. Pulmonary embolism treated 6. Panhypopituitarism at baseline   I have personally seen and evaluated the  patient, evaluated laboratory and imaging results, formulated the assessment and plan and placed orders. The Patient requires high complexity decision making for assessment and support.  Case was discussed on Rounds with the Respiratory Therapy Staff  Allyne Gee, MD [facility] Pulmonary Critical Care Medicine Sleep Medicine

## 2017-08-11 LAB — PROTIME-INR
INR: 2.6
Prothrombin Time: 27.7 seconds — ABNORMAL HIGH (ref 11.4–15.2)

## 2017-08-12 DIAGNOSIS — J181 Lobar pneumonia, unspecified organism: Secondary | ICD-10-CM | POA: Diagnosis not present

## 2017-08-12 DIAGNOSIS — G931 Anoxic brain damage, not elsewhere classified: Secondary | ICD-10-CM | POA: Diagnosis not present

## 2017-08-12 DIAGNOSIS — J9621 Acute and chronic respiratory failure with hypoxia: Secondary | ICD-10-CM | POA: Diagnosis not present

## 2017-08-12 DIAGNOSIS — I2699 Other pulmonary embolism without acute cor pulmonale: Secondary | ICD-10-CM | POA: Diagnosis not present

## 2017-08-12 DIAGNOSIS — N179 Acute kidney failure, unspecified: Secondary | ICD-10-CM | POA: Diagnosis not present

## 2017-08-12 DIAGNOSIS — E230 Hypopituitarism: Secondary | ICD-10-CM | POA: Diagnosis not present

## 2017-08-12 LAB — PROTIME-INR
INR: 3.16
Prothrombin Time: 32.2 seconds — ABNORMAL HIGH (ref 11.4–15.2)

## 2017-08-12 NOTE — Progress Notes (Signed)
Pulmonary [place]   PULMONARY SERVICE  PROGRESS NOTE  Date of Service: 08/12/2017  Hinata Diener  [id number]  DOB: [birth year]   DOA: 07/24/2017  Referring Physician: Merton Border, MD  HPI: Cianna Kasparian is a 37 y.o. female seen for follow up of Acute on Chronic Respiratory Failure.  She is doing well off the ventilator has been on T collar for about 48 hours looks good right now we should be able to continue with T collar.  Medications: Reviewed on Rounds  Physical Exam:  Vitals: Temperature 96.4 pulse 93 respiratory rate 17 blood pressure 174/99 saturations 98%  Ventilator Settings currently off of the ventilator as mentioned on 28%  . General: Comfortable at this time . Eyes: Grossly normal lids, irises & conjunctiva . ENT: grossly tongue is normal . Neck: no obvious mass . Cardiovascular: S1 S2 normal no gallop . Respiratory: No rhonchi expansion is equal . Abdomen: soft . Skin: no rash seen on limited exam . Musculoskeletal: not rigid . Psychiatric:unable to assess . Neurologic: no seizure no involuntary movements         Lab Data:   Basic Metabolic Panel: Recent Labs  Lab 08/07/17 0610 08/08/17 0608 08/09/17 0620 08/10/17 0449 08/10/17 1102  NA 148*  --   --  136 137  K 3.3* 3.3* 3.2* 3.4* 3.4*  CL 112*  --   --  99* 100*  CO2 29  --   --  28 26  GLUCOSE 291*  --   --  338* 341*  BUN 28*  --   --  24* 24*  CREATININE 0.60  --   --  0.45 0.44  CALCIUM 8.6*  --   --  9.0 9.3  MG 2.0  --   --  2.0  --     Liver Function Tests: Recent Labs  Lab 08/07/17 0610 08/10/17 0449  AST 17 16  ALT 13* 13*  ALKPHOS 66 73  BILITOT 0.7 0.6  PROT 6.0* 5.7*  ALBUMIN 2.4* 2.5*   No results for input(s): LIPASE, AMYLASE in the last 168 hours. No results for input(s): AMMONIA in the last 168 hours.  CBC: Recent Labs  Lab 08/10/17 1102  WBC 17.6*  HGB 11.3*  HCT 38.2  MCV 80.3  PLT 448*     Cardiac Enzymes: No results for input(s): CKTOTAL, CKMB, CKMBINDEX, TROPONINI in the last 168 hours.  BNP (last 3 results) No results for input(s): BNP in the last 8760 hours.  ProBNP (last 3 results) No results for input(s): PROBNP in the last 8760 hours.  Radiological Exams: No results found.  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 Acute on chronic respiratory failure with hypoxia we will continue with T collar continue pulmonary toilet supportive care patient prognosis overall is guarded. 2. Lobar pneumonia treated with antibiotics we will continue to follow. 3. Panhypopituitarism at baseline prognosis guarded 4. Acute renal failure improving follow labs 5. Chronic anoxic encephalopathy she is at baseline 6. Pulmonary embolism treated   I have personally seen and evaluated the patient, evaluated laboratory and imaging results, formulated the assessment and plan and placed orders. The Patient requires high complexity decision making for assessment and support.  Case was discussed on  Rounds with the Respiratory Therapy Staff  Allyne Gee, MD [facility] Pulmonary Critical Care Medicine Sleep Medicine

## 2017-08-13 LAB — CBC
HEMATOCRIT: 35.6 % — AB (ref 36.0–46.0)
Hemoglobin: 10.3 g/dL — ABNORMAL LOW (ref 12.0–15.0)
MCH: 23.6 pg — ABNORMAL LOW (ref 26.0–34.0)
MCHC: 28.9 g/dL — AB (ref 30.0–36.0)
MCV: 81.7 fL (ref 78.0–100.0)
PLATELETS: 355 10*3/uL (ref 150–400)
RBC: 4.36 MIL/uL (ref 3.87–5.11)
RDW: 18.1 % — AB (ref 11.5–15.5)
WBC: 13.1 10*3/uL — ABNORMAL HIGH (ref 4.0–10.5)

## 2017-08-13 LAB — COMPREHENSIVE METABOLIC PANEL
ALBUMIN: 2.7 g/dL — AB (ref 3.5–5.0)
ALT: 15 U/L (ref 14–54)
AST: 19 U/L (ref 15–41)
Alkaline Phosphatase: 86 U/L (ref 38–126)
Anion gap: 8 (ref 5–15)
BUN: 17 mg/dL (ref 6–20)
CHLORIDE: 106 mmol/L (ref 101–111)
CO2: 30 mmol/L (ref 22–32)
CREATININE: 0.39 mg/dL — AB (ref 0.44–1.00)
Calcium: 9.2 mg/dL (ref 8.9–10.3)
GFR calc Af Amer: >60 mL/min (ref >60)
GFR calc non Af Amer: >60 mL/min (ref >60)
GLUCOSE: 230 mg/dL — AB (ref 65–99)
POTASSIUM: 4 mmol/L (ref 3.5–5.1)
Sodium: 144 mmol/L (ref 135–145)
Total Bilirubin: 0.6 mg/dL (ref 0.3–1.2)
Total Protein: 5.8 g/dL — ABNORMAL LOW (ref 6.5–8.1)

## 2017-08-13 LAB — MAGNESIUM: Magnesium: 1.9 mg/dL (ref 1.7–2.4)

## 2017-08-13 LAB — TRIGLYCERIDES: TRIGLYCERIDES: 119 mg/dL (ref <150)

## 2017-08-14 LAB — COMPREHENSIVE METABOLIC PANEL
ALBUMIN: 2.7 g/dL — AB (ref 3.5–5.0)
ALK PHOS: 86 U/L (ref 38–126)
ALT: 14 U/L (ref 14–54)
AST: 21 U/L (ref 15–41)
Anion gap: 7 (ref 5–15)
BUN: 21 mg/dL — ABNORMAL HIGH (ref 6–20)
CALCIUM: 9.4 mg/dL (ref 8.9–10.3)
CHLORIDE: 105 mmol/L (ref 101–111)
CO2: 32 mmol/L (ref 22–32)
CREATININE: 0.39 mg/dL — AB (ref 0.44–1.00)
GFR calc Af Amer: >60 mL/min (ref >60)
GFR calc non Af Amer: >60 mL/min (ref >60)
GLUCOSE: 198 mg/dL — AB (ref 65–99)
Potassium: 3.5 mmol/L (ref 3.5–5.1)
Sodium: 144 mmol/L (ref 135–145)
Total Bilirubin: 0.5 mg/dL (ref 0.3–1.2)
Total Protein: 6.1 g/dL — ABNORMAL LOW (ref 6.5–8.1)

## 2017-08-14 LAB — MAGNESIUM: Magnesium: 1.9 mg/dL (ref 1.7–2.4)

## 2017-08-14 LAB — PROTIME-INR
INR: 2.54
Prothrombin Time: 27.1 seconds — ABNORMAL HIGH (ref 11.4–15.2)

## 2017-08-14 LAB — PHOSPHORUS: Phosphorus: 2.8 mg/dL (ref 2.5–4.6)

## 2017-08-14 LAB — TRIGLYCERIDES: TRIGLYCERIDES: 80 mg/dL (ref <150)

## 2017-08-15 ENCOUNTER — Other Ambulatory Visit (HOSPITAL_COMMUNITY): Payer: Medicaid Other

## 2017-08-15 LAB — PROTIME-INR
INR: 2.56
Prothrombin Time: 27.3 seconds — ABNORMAL HIGH (ref 11.4–15.2)

## 2017-08-15 MED ORDER — IOHEXOL 300 MG/ML  SOLN
100.0000 mL | Freq: Once | INTRAMUSCULAR | Status: AC | PRN
  Administered 2017-08-15: 100 mL via INTRAVENOUS

## 2017-08-15 NOTE — Progress Notes (Signed)
Request for gastrostomy tube placement.   Tube was pulled 07/30/17 due to being dislodged.  Tract assessed today.  Appears to be healing well. Small amount of drainage on dressing, but site is clean and dry.   Reviewed case with Dr. Pascal Lux who recommends repeat CT Abdomen Pelvis W/WO; IV contrast only.  This has been ordered.   Brynda Greathouse, MS RD PA-C 12:46 PM

## 2017-08-16 LAB — PROTIME-INR
INR: 2.94
Prothrombin Time: 30.4 seconds — ABNORMAL HIGH (ref 11.4–15.2)

## 2017-08-16 NOTE — Progress Notes (Signed)
Patient ID: Debra Sanford, female   DOB: 12-Jul-1980, 37 y.o.   MRN: [id number]   Request for percutaneous gastric tube placement in IR  CLINICAL DATA:  Recent history of malpositioned gastrostomy tube (placed at outside institution). Patient's gastrostomy tube was subsequently removed, however request has been made for potential percutaneous insertion of the new gastrostomy tube for enteric nutrition supplementation purposes.  IMPRESSION: 1. Interval development of an approximately 7.5 cm mixed air and fluid containing collection adjacent to the mid body of the stomach regional to the expected location prior gastrostomy tube access site. This finding is also associated with interval progression of now moderate amount pneumoperitoneum. Additionally, fluid is again seen tracking from the left upper abdominal quadrant to the ventral aspect of the left mid hemiabdomen, however this appears grossly unchanged compared to the 07/30/2017 examination. Surgical consultation for definitive operative repair of the gastric rent and potential placement of a new gastrostomy tube may be performed as clinically indicated.  Not a candidate for IR percutaneous G tube placement Consider surgical consult/repair and replacement

## 2017-08-17 LAB — PHOSPHORUS: Phosphorus: 3 mg/dL (ref 2.5–4.6)

## 2017-08-17 LAB — COMPREHENSIVE METABOLIC PANEL
ALK PHOS: 83 U/L (ref 38–126)
ALT: 15 U/L (ref 14–54)
AST: 19 U/L (ref 15–41)
Albumin: 2.8 g/dL — ABNORMAL LOW (ref 3.5–5.0)
Anion gap: 4 — ABNORMAL LOW (ref 5–15)
BUN: 20 mg/dL (ref 6–20)
CALCIUM: 9.1 mg/dL (ref 8.9–10.3)
CO2: 35 mmol/L — AB (ref 22–32)
Chloride: 103 mmol/L (ref 101–111)
Creatinine, Ser: 0.33 mg/dL — ABNORMAL LOW (ref 0.44–1.00)
Glucose, Bld: 233 mg/dL — ABNORMAL HIGH (ref 65–99)
Potassium: 3.3 mmol/L — ABNORMAL LOW (ref 3.5–5.1)
SODIUM: 142 mmol/L (ref 135–145)
Total Bilirubin: 0.7 mg/dL (ref 0.3–1.2)
Total Protein: 6.1 g/dL — ABNORMAL LOW (ref 6.5–8.1)

## 2017-08-17 LAB — PROTIME-INR
INR: 2.51
PROTHROMBIN TIME: 26.9 s — AB (ref 11.4–15.2)

## 2017-08-17 LAB — MAGNESIUM: MAGNESIUM: 1.8 mg/dL (ref 1.7–2.4)

## 2017-08-17 LAB — TRIGLYCERIDES: Triglycerides: 54 mg/dL (ref <150)

## 2017-08-18 LAB — PROTIME-INR
INR: 2.26
PROTHROMBIN TIME: 24.8 s — AB (ref 11.4–15.2)

## 2017-08-18 LAB — POTASSIUM: Potassium: 3.4 mmol/L — ABNORMAL LOW (ref 3.5–5.1)

## 2017-08-19 DIAGNOSIS — G931 Anoxic brain damage, not elsewhere classified: Secondary | ICD-10-CM | POA: Diagnosis not present

## 2017-08-19 DIAGNOSIS — E230 Hypopituitarism: Secondary | ICD-10-CM | POA: Diagnosis not present

## 2017-08-19 DIAGNOSIS — J9621 Acute and chronic respiratory failure with hypoxia: Secondary | ICD-10-CM | POA: Diagnosis not present

## 2017-08-19 DIAGNOSIS — N179 Acute kidney failure, unspecified: Secondary | ICD-10-CM | POA: Diagnosis not present

## 2017-08-19 DIAGNOSIS — I2699 Other pulmonary embolism without acute cor pulmonale: Secondary | ICD-10-CM | POA: Diagnosis not present

## 2017-08-19 DIAGNOSIS — J181 Lobar pneumonia, unspecified organism: Secondary | ICD-10-CM | POA: Diagnosis not present

## 2017-08-19 LAB — PROTIME-INR
INR: 2.56
Prothrombin Time: 27.3 seconds — ABNORMAL HIGH (ref 11.4–15.2)

## 2017-08-19 LAB — POTASSIUM: POTASSIUM: 4.5 mmol/L (ref 3.5–5.1)

## 2017-08-19 NOTE — Progress Notes (Signed)
Pulmonary [place]   PULMONARY SERVICE  PROGRESS NOTE  Date of Service: 08/19/2017  Arshi Duarte  ZDG:[id number]  DOB: Jul 04, 1980   DOA: 07/24/2017  Referring Physician: Merton Border, MD  HPI: Debra Sanford is a 37 y.o. female seen for follow up of Acute on Chronic Respiratory Failure.  She is weaning right now patient has been on T collar has been on 28% oxygen good saturations are noted.  The patient still has an XLT trach in place should be able to hopefully change to a #6  Medications: Reviewed on Rounds  Physical Exam:  Vitals: Temperature 97.3 pulse 96 respiratory rate 18 blood pressure 173/92 saturations 100%  Ventilator Settings off of the ventilator on T collar 28% FiO2  . General: Comfortable at this time . Eyes: Grossly normal lids, irises & conjunctiva . ENT: grossly tongue is normal . Neck: no obvious mass . Cardiovascular: S1 S2 normal no gallop . Respiratory: Good air entry no rhonchi expansion is equal . Abdomen: soft . Skin: no rash seen on limited exam . Musculoskeletal: not rigid . Psychiatric:unable to assess . Neurologic: no seizure no involuntary movements         Lab Data:   Basic Metabolic Panel: Recent Labs  Lab 08/13/17 0220 08/14/17 0723 08/16/17 2343 08/17/17 0837 08/18/17 1016 08/19/17 1109  NA 144 144  --  142  --   --   K 4.0 3.5  --  3.3* 3.4* 4.5  CL 106 105  --  103  --   --   CO2 30 32  --  35*  --   --   GLUCOSE 230* 198*  --  233*  --   --   BUN 17 21*  --  20  --   --   CREATININE 0.39* 0.39*  --  0.33*  --   --   CALCIUM 9.2 9.4  --  9.1  --   --   MG 1.9 1.9 1.8  --   --   --   PHOS  --  2.8  --  3.0  --   --     Liver Function Tests: Recent Labs  Lab 08/13/17 0220 08/14/17 0723 08/17/17 0837  AST 19 21 19   ALT 15 14 15   ALKPHOS 86 86 83  BILITOT 0.6 0.5 0.7  PROT 5.8* 6.1* 6.1*  ALBUMIN 2.7* 2.7* 2.8*   No results for input(s): LIPASE, AMYLASE in  the last 168 hours. No results for input(s): AMMONIA in the last 168 hours.  CBC: Recent Labs  Lab 08/13/17 0220  WBC 13.1*  HGB 10.3*  HCT 35.6*  MCV 81.7  PLT 355    Cardiac Enzymes: No results for input(s): CKTOTAL, CKMB, CKMBINDEX, TROPONINI in the last 168 hours.  BNP (last 3 results) No results for input(s): BNP in the last 8760 hours.  ProBNP (last 3 results) No results for input(s): PROBNP in the last 8760 hours.  Radiological Exams: No results found.  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 Acute on chronic respiratory failure with hypoxia we will continue with T collar trials continue pulmonary toilet supportive care patient prognosis remains guarded. 2. Lobar pneumonia treated with antibiotics stable at this time 3.  Panhypopituitarism stable we will continue present management. 4. Chronic anoxic encephalopathy grossly unchanged we will monitor 5. Pulmonary embolism treated   I have personally seen and evaluated the patient, evaluated laboratory and imaging results, formulated the assessment and plan and placed orders. The Patient requires high complexity decision making for assessment and support.  Case was discussed on Rounds with the Respiratory Therapy Staff  Allyne Gee, MD [facility] Pulmonary Critical Care Medicine Sleep Medicine

## 2017-08-20 ENCOUNTER — Institutional Professional Consult (permissible substitution) (HOSPITAL_COMMUNITY): Payer: Medicaid Other

## 2017-08-20 ENCOUNTER — Other Ambulatory Visit: Payer: Self-pay | Admitting: Internal Medicine

## 2017-08-20 DIAGNOSIS — J9621 Acute and chronic respiratory failure with hypoxia: Secondary | ICD-10-CM | POA: Diagnosis not present

## 2017-08-20 DIAGNOSIS — I2699 Other pulmonary embolism without acute cor pulmonale: Secondary | ICD-10-CM | POA: Diagnosis not present

## 2017-08-20 DIAGNOSIS — G931 Anoxic brain damage, not elsewhere classified: Secondary | ICD-10-CM | POA: Diagnosis not present

## 2017-08-20 DIAGNOSIS — J181 Lobar pneumonia, unspecified organism: Secondary | ICD-10-CM | POA: Diagnosis not present

## 2017-08-20 DIAGNOSIS — E230 Hypopituitarism: Secondary | ICD-10-CM | POA: Diagnosis not present

## 2017-08-20 DIAGNOSIS — N179 Acute kidney failure, unspecified: Secondary | ICD-10-CM | POA: Diagnosis not present

## 2017-08-20 LAB — MAGNESIUM
MAGNESIUM: 1.9 mg/dL (ref 1.7–2.4)
MAGNESIUM: 1.7 mg/dL (ref 1.7–2.4)

## 2017-08-20 LAB — PROTIME-INR
INR: 2.41
Prothrombin Time: 26 seconds — ABNORMAL HIGH (ref 11.4–15.2)

## 2017-08-20 LAB — CBC
HEMATOCRIT: 35.9 % — AB (ref 36.0–46.0)
HEMOGLOBIN: 10.3 g/dL — AB (ref 12.0–15.0)
MCH: 24 pg — AB (ref 26.0–34.0)
MCHC: 28.7 g/dL — ABNORMAL LOW (ref 30.0–36.0)
MCV: 83.5 fL (ref 78.0–100.0)
Platelets: UNDETERMINED 10*3/uL (ref 150–400)
RBC: 4.3 MIL/uL (ref 3.87–5.11)
RDW: 19.1 % — ABNORMAL HIGH (ref 11.5–15.5)
WBC: 10.7 10*3/uL — ABNORMAL HIGH (ref 4.0–10.5)

## 2017-08-20 LAB — COMPREHENSIVE METABOLIC PANEL
ALBUMIN: 2.8 g/dL — AB (ref 3.5–5.0)
ALK PHOS: 89 U/L (ref 38–126)
ALT: 15 U/L (ref 0–44)
ANION GAP: 7 (ref 5–15)
AST: 17 U/L (ref 15–41)
BUN: 20 mg/dL (ref 6–20)
CALCIUM: 9.2 mg/dL (ref 8.9–10.3)
CO2: 36 mmol/L — AB (ref 22–32)
Chloride: 99 mmol/L (ref 98–111)
Creatinine, Ser: 0.38 mg/dL — ABNORMAL LOW (ref 0.44–1.00)
GFR calc non Af Amer: >60 mL/min (ref >60)
Glucose, Bld: 184 mg/dL — ABNORMAL HIGH (ref 70–99)
POTASSIUM: 3.2 mmol/L — AB (ref 3.5–5.1)
SODIUM: 142 mmol/L (ref 135–145)
Total Bilirubin: 0.6 mg/dL (ref 0.3–1.2)
Total Protein: 5.9 g/dL — ABNORMAL LOW (ref 6.5–8.1)

## 2017-08-20 LAB — POTASSIUM: Potassium: 4.5 mmol/L (ref 3.5–5.1)

## 2017-08-20 LAB — PHOSPHORUS: Phosphorus: 3.1 mg/dL (ref 2.5–4.6)

## 2017-08-20 LAB — TRIGLYCERIDES: TRIGLYCERIDES: 66 mg/dL (ref <150)

## 2017-08-20 NOTE — Progress Notes (Signed)
Pulmonary [place]   PULMONARY SERVICE  PROGRESS NOTE  Date of Service: 08/20/2017  Debra Sanford  YQM:[id number]  DOB: 02-Jul-1980   DOA: 07/24/2017  Referring Physician: Merton Border, MD  HPI: Debra Sanford is a 37 y.o. female seen for follow up of Acute on Chronic Respiratory Failure.  Patient is comfortable right now no distress.  Has been on T collar currently 28% FiO2 tolerating the PMV.  We should hopefully be able to proceed towards capping if secretions are adequately controlled  Medications: Reviewed on Rounds  Physical Exam:  Vitals: Temperature 97.0 pulse 71 respiratory rate 11 blood pressure 181/112 saturations 98%  Ventilator Settings off of the ventilator on T collar FiO2 28%  . General: Comfortable at this time . Eyes: Grossly normal lids, irises & conjunctiva . ENT: grossly tongue is normal . Neck: no obvious mass . Cardiovascular: S1 S2 normal no gallop . Respiratory: No rhonchi or rales are noted at this time . Abdomen: soft . Skin: no rash seen on limited exam . Musculoskeletal: not rigid . Psychiatric:unable to assess . Neurologic: no seizure no involuntary movements         Lab Data:   Basic Metabolic Panel: Recent Labs  Lab 08/14/17 0723 08/16/17 2343 08/17/17 0837 08/18/17 1016 08/19/17 1109 08/20/17 0013 08/20/17 0747  NA 144  --  142  --   --   --  142  K 3.5  --  3.3* 3.4* 4.5  --  3.2*  CL 105  --  103  --   --   --  99  CO2 32  --  35*  --   --   --  36*  GLUCOSE 198*  --  233*  --   --   --  184*  BUN 21*  --  20  --   --   --  20  CREATININE 0.39*  --  0.33*  --   --   --  0.38*  CALCIUM 9.4  --  9.1  --   --   --  9.2  MG 1.9 1.8  --   --   --  1.7  --   PHOS 2.8  --  3.0  --   --   --  3.1    Liver Function Tests: Recent Labs  Lab 08/14/17 0723 08/17/17 0837 08/20/17 0747  AST 21 19 17   ALT 14 15 15   ALKPHOS 86 83 89  BILITOT 0.5 0.7 0.6  PROT 6.1* 6.1* 5.9*   ALBUMIN 2.7* 2.8* 2.8*   No results for input(s): LIPASE, AMYLASE in the last 168 hours. No results for input(s): AMMONIA in the last 168 hours.  CBC: Recent Labs  Lab 08/20/17 0747  WBC 10.7*  HGB 10.3*  HCT 35.9*  MCV 83.5  PLT PLATELET CLUMPS NOTED ON SMEAR, UNABLE TO ESTIMATE    Cardiac Enzymes: No results for input(s): CKTOTAL, CKMB, CKMBINDEX, TROPONINI in the last 168 hours.  BNP (last 3 results) No results for input(s): BNP in the last 8760 hours.  ProBNP (last 3 results) No results for input(s): PROBNP in the last 8760 hours.  Radiological Exams: Dg Chest Port 1 View  Result Date: 08/20/2017 CLINICAL DATA:  Respiratory failure EXAM: PORTABLE CHEST 1 VIEW COMPARISON:  07/28/2017 FINDINGS: Tracheostomy tube is noted in satisfactory position. Right-sided PICC line is noted in the proximal superior vena cava. Nasogastric catheter is noted as well. Heart is again enlarged in  size. Increased density in the left hemithorax is noted in part due to a posteriorly layering effusion as well as likely underlying atelectatic change. IMPRESSION: Increasing density in the left hemithorax as described. Tubes and lines as described. Electronically Signed   By: Inez Catalina M.D.   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 Acute on chronic respiratory failure with hypoxia we will continue with present management continue secretion management pulmonary toilet patient should be able to hopefully proceed to capping trials 2. Lobar pneumonia treated clinically improved 3. Panhypopituitarism stable we will continue present management 4. Acute renal failure treated labs and improved 5. Chronic anoxic  encephalopathy much improved clinically 6. Pulmonary embolism treated   I have personally seen and evaluated the patient, evaluated laboratory and imaging results, formulated the assessment and plan and placed orders. The Patient requires high complexity decision making for assessment and support.  Case was discussed on Rounds with the Respiratory Therapy Staff  Allyne Gee, MD [facility] Pulmonary Critical Care Medicine Sleep Medicine

## 2017-08-21 DIAGNOSIS — J9621 Acute and chronic respiratory failure with hypoxia: Secondary | ICD-10-CM | POA: Diagnosis not present

## 2017-08-21 DIAGNOSIS — E230 Hypopituitarism: Secondary | ICD-10-CM | POA: Diagnosis not present

## 2017-08-21 DIAGNOSIS — N179 Acute kidney failure, unspecified: Secondary | ICD-10-CM | POA: Diagnosis not present

## 2017-08-21 DIAGNOSIS — G931 Anoxic brain damage, not elsewhere classified: Secondary | ICD-10-CM | POA: Diagnosis not present

## 2017-08-21 DIAGNOSIS — J181 Lobar pneumonia, unspecified organism: Secondary | ICD-10-CM | POA: Diagnosis not present

## 2017-08-21 DIAGNOSIS — I2699 Other pulmonary embolism without acute cor pulmonale: Secondary | ICD-10-CM | POA: Diagnosis not present

## 2017-08-21 LAB — PROTIME-INR
INR: 2.18
Prothrombin Time: 24.1 seconds — ABNORMAL HIGH (ref 11.4–15.2)

## 2017-08-21 NOTE — Progress Notes (Signed)
Pulmonary [place]   PULMONARY SERVICE  PROGRESS NOTE  Date of Service: 08/21/2017  Debra Sanford  NUU:[id number]  DOB: Jan 08, 1981   DOA: 07/24/2017  Referring Physician: Merton Border, MD  HPI: Debra Sanford is a 37 y.o. female seen for follow up of Acute on Chronic Respiratory Failure.  Currently is on T collar patient's been tolerating PMV I am going to try to see about capping her today  Medications: Reviewed on Rounds  Physical Exam:  Vitals: Temperature 97.4 pulse 73 respiratory 14 blood pressure 145/90 saturations 99%  Ventilator Settings off the ventilator on T collar FiO2 20% with the PMV in place  . General: Comfortable at this time . Eyes: Grossly normal lids, irises & conjunctiva . ENT: grossly tongue is normal . Neck: no obvious mass . Cardiovascular: S1 S2 normal no gallop . Respiratory: No rhonchi or rales are noted . Abdomen: soft . Skin: no rash seen on limited exam . Musculoskeletal: not rigid . Psychiatric:unable to assess . Neurologic: no seizure no involuntary movements         Lab Data:   Basic Metabolic Panel: Recent Labs  Lab 08/16/17 2343 08/17/17 0837 08/18/17 1016 08/19/17 1109 08/20/17 0013 08/20/17 0747 08/20/17 1947  NA  --  142  --   --   --  142  --   K  --  3.3* 3.4* 4.5  --  3.2* 4.5  CL  --  103  --   --   --  99  --   CO2  --  35*  --   --   --  36*  --   GLUCOSE  --  233*  --   --   --  184*  --   BUN  --  20  --   --   --  20  --   CREATININE  --  0.33*  --   --   --  0.38*  --   CALCIUM  --  9.1  --   --   --  9.2  --   MG 1.8  --   --   --  1.7  --  1.9  PHOS  --  3.0  --   --   --  3.1  --     Liver Function Tests: Recent Labs  Lab 08/17/17 0837 08/20/17 0747  AST 19 17  ALT 15 15  ALKPHOS 83 89  BILITOT 0.7 0.6  PROT 6.1* 5.9*  ALBUMIN 2.8* 2.8*   No results for input(s): LIPASE, AMYLASE in the last 168 hours. No results for input(s): AMMONIA in  the last 168 hours.  CBC: Recent Labs  Lab 08/20/17 0747  WBC 10.7*  HGB 10.3*  HCT 35.9*  MCV 83.5  PLT PLATELET CLUMPS NOTED ON SMEAR, UNABLE TO ESTIMATE    Cardiac Enzymes: No results for input(s): CKTOTAL, CKMB, CKMBINDEX, TROPONINI in the last 168 hours.  BNP (last 3 results) No results for input(s): BNP in the last 8760 hours.  ProBNP (last 3 results) No results for input(s): PROBNP in the last 8760 hours.  Radiological Exams: Dg Chest Port 1 View  Result Date: 08/20/2017 CLINICAL DATA:  Respiratory failure EXAM: PORTABLE CHEST 1 VIEW COMPARISON:  07/28/2017 FINDINGS: Tracheostomy tube is noted in satisfactory position. Right-sided PICC line is noted in the proximal superior vena cava. Nasogastric catheter is noted as well. Heart is again enlarged in size. Increased density in the left  hemithorax is noted in part due to a posteriorly layering effusion as well as likely underlying atelectatic change. IMPRESSION: Increasing density in the left hemithorax as described. Tubes and lines as described. Electronically Signed   By: Inez Catalina M.D.   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 Acute on chronic respiratory failure with hypoxia we will continue with weaning and advance to capping today. 2. Lobar pneumonia treated we will follow 3. Panhypopituitarism stable at this time 4. Acute renal failure labs have been stable 5. Chronic anoxic encephalopathy we will continue with present management 6. Pulmonary embolism at baseline   I have personally seen and evaluated the patient, evaluated laboratory and imaging results, formulated the assessment and plan and placed orders. The Patient requires  high complexity decision making for assessment and support.  Case was discussed on Rounds with the Respiratory Therapy Staff  Allyne Gee, MD [facility] Pulmonary Critical Care Medicine Sleep Medicine

## 2017-08-22 LAB — PROTIME-INR
INR: 1.64
PROTHROMBIN TIME: 19.3 s — AB (ref 11.4–15.2)

## 2017-08-23 ENCOUNTER — Institutional Professional Consult (permissible substitution) (HOSPITAL_COMMUNITY): Payer: Medicaid Other

## 2017-08-23 DIAGNOSIS — J9621 Acute and chronic respiratory failure with hypoxia: Secondary | ICD-10-CM | POA: Diagnosis not present

## 2017-08-23 DIAGNOSIS — J181 Lobar pneumonia, unspecified organism: Secondary | ICD-10-CM | POA: Diagnosis not present

## 2017-08-23 DIAGNOSIS — N179 Acute kidney failure, unspecified: Secondary | ICD-10-CM | POA: Diagnosis not present

## 2017-08-23 DIAGNOSIS — G931 Anoxic brain damage, not elsewhere classified: Secondary | ICD-10-CM | POA: Diagnosis not present

## 2017-08-23 LAB — PROTIME-INR
INR: 1.5
PROTHROMBIN TIME: 18 s — AB (ref 11.4–15.2)

## 2017-08-23 LAB — COMPREHENSIVE METABOLIC PANEL
ALBUMIN: 2.9 g/dL — AB (ref 3.5–5.0)
ALT: 14 U/L (ref 0–44)
AST: 17 U/L (ref 15–41)
Alkaline Phosphatase: 83 U/L (ref 38–126)
Anion gap: 7 (ref 5–15)
BUN: 17 mg/dL (ref 6–20)
CO2: 37 mmol/L — ABNORMAL HIGH (ref 22–32)
Calcium: 8.9 mg/dL (ref 8.9–10.3)
Chloride: 97 mmol/L — ABNORMAL LOW (ref 98–111)
Creatinine, Ser: 0.35 mg/dL — ABNORMAL LOW (ref 0.44–1.00)
GFR calc Af Amer: >60 mL/min (ref >60)
GFR calc non Af Amer: >60 mL/min (ref >60)
GLUCOSE: 161 mg/dL — AB (ref 70–99)
POTASSIUM: 3.5 mmol/L (ref 3.5–5.1)
Sodium: 141 mmol/L (ref 135–145)
Total Bilirubin: 0.6 mg/dL (ref 0.3–1.2)
Total Protein: 6 g/dL — ABNORMAL LOW (ref 6.5–8.1)

## 2017-08-23 LAB — PHOSPHORUS: PHOSPHORUS: 3 mg/dL (ref 2.5–4.6)

## 2017-08-23 LAB — MAGNESIUM: Magnesium: 1.7 mg/dL (ref 1.7–2.4)

## 2017-08-23 LAB — TRIGLYCERIDES: TRIGLYCERIDES: 63 mg/dL (ref <150)

## 2017-08-23 NOTE — Progress Notes (Signed)
Pulmonary [place]   PULMONARY SERVICE  PROGRESS NOTE  Date of Service: 08/23/2017  Debra Sanford  YTK:[id number]  DOB: 12/23/1980   DOA: 07/24/2017  Referring Physician: Merton Border, MD  HPI: Debra Sanford is a 37 y.o. female seen for follow up of Acute on Chronic Respiratory Failure.  She remains on T collar has been comfortable without distress.  Has a 6 XLT cuffless trach in place  Medications: Reviewed on Rounds  Physical Exam:  Vitals: Temperature 97.1 pulse 80 respiratory rate 29 blood pressure 143/99 saturation 98%  Ventilator Settings mode of ventilation is T collar spontaneous on 28% FiO2  . General: Comfortable at this time . Eyes: Grossly normal lids, irises & conjunctiva . ENT: grossly tongue is normal . Neck: no obvious mass . Cardiovascular: S1 S2 normal no gallop . Respiratory: No rhonchi expansion is equal . Abdomen: soft . Skin: no rash seen on limited exam . Musculoskeletal: not rigid . Psychiatric:unable to assess . Neurologic: no seizure no involuntary movements         Lab Data:   Basic Metabolic Panel: Recent Labs  Lab 08/16/17 2343  08/17/17 0837 08/18/17 1016 08/19/17 1109 08/20/17 0013 08/20/17 0747 08/20/17 1947 08/23/17 0617  NA  --   --  142  --   --   --  142  --  141  K  --    < > 3.3* 3.4* 4.5  --  3.2* 4.5 3.5  CL  --   --  103  --   --   --  99  --  97*  CO2  --   --  35*  --   --   --  36*  --  37*  GLUCOSE  --   --  233*  --   --   --  184*  --  161*  BUN  --   --  20  --   --   --  20  --  17  CREATININE  --   --  0.33*  --   --   --  0.38*  --  0.35*  CALCIUM  --   --  9.1  --   --   --  9.2  --  8.9  MG 1.8  --   --   --   --  1.7  --  1.9 1.7  PHOS  --   --  3.0  --   --   --  3.1  --  3.0   < > = values in this interval not displayed.    Liver Function Tests: Recent Labs  Lab 08/17/17 0837 08/20/17 0747 08/23/17 0617  AST 19 17 17   ALT 15 15 14    ALKPHOS 83 89 83  BILITOT 0.7 0.6 0.6  PROT 6.1* 5.9* 6.0*  ALBUMIN 2.8* 2.8* 2.9*   No results for input(s): LIPASE, AMYLASE in the last 168 hours. No results for input(s): AMMONIA in the last 168 hours.  CBC: Recent Labs  Lab 08/20/17 0747  WBC 10.7*  HGB 10.3*  HCT 35.9*  MCV 83.5  PLT PLATELET CLUMPS NOTED ON SMEAR, UNABLE TO ESTIMATE    Cardiac Enzymes: No results for input(s): CKTOTAL, CKMB, CKMBINDEX, TROPONINI in the last 168 hours.  BNP (last 3 results) No results for input(s): BNP in the last 8760 hours.  ProBNP (last 3 results) No results for input(s): PROBNP in the last 8760 hours.  Radiological  Exams: No results found.  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 Acute on chronic respiratory failure with hypoxia we will continue with T collar trials she was attempted at capping I think yesterday did not tolerate it very well.  We need to continue to monitor.  Underlying issues with sleep apnea may be limiting factor as far as being able to Decannulate. 2. Lobar pneumonia treated with antibiotics we will continue to follow 3. Panhypopituitarism at baseline 4. Acute renal failure labs been stable 5. Chronic encephalopathy clinically improving 6. Pulmonary embolism treated   I have personally seen and evaluated the patient, evaluated laboratory and imaging results, formulated the assessment and plan and placed orders. The Patient requires high complexity decision making for assessment and support.  Case was discussed on Rounds with the Respiratory Therapy Staff  Allyne Gee, MD [facility] Pulmonary Critical Care Medicine Sleep Medicine

## 2017-08-24 DIAGNOSIS — G931 Anoxic brain damage, not elsewhere classified: Secondary | ICD-10-CM | POA: Diagnosis not present

## 2017-08-24 DIAGNOSIS — J181 Lobar pneumonia, unspecified organism: Secondary | ICD-10-CM | POA: Diagnosis not present

## 2017-08-24 DIAGNOSIS — J9621 Acute and chronic respiratory failure with hypoxia: Secondary | ICD-10-CM | POA: Diagnosis not present

## 2017-08-24 DIAGNOSIS — N179 Acute kidney failure, unspecified: Secondary | ICD-10-CM | POA: Diagnosis not present

## 2017-08-24 LAB — PROTIME-INR
INR: 1.79
Prothrombin Time: 20.7 seconds — ABNORMAL HIGH (ref 11.4–15.2)

## 2017-08-24 NOTE — Progress Notes (Signed)
[name]   PULMONARY SERVICE  PROGRESS NOTE  Date of Service: 08/24/2017  Debra Sanford  VQQ:[id number]  DOB: [birth year]   DOA: 07/24/2017  Referring Physician: Merton Border, MD  HPI: Debra Sanford is a 37 y.o. female seen for follow up of Acute on Chronic Respiratory Failure.  Patient remains on T collar has been doing fairly well currently is on 28% oxygen.  No distress noted at this time  Medications: Reviewed on Rounds  Physical Exam:  Vitals:  Temperature is 98.3 degrees pulse 99 respiratory rate 19 blood pressure 145/87 saturations 99%  Ventilator Settings  Off the ventilator on T collar currently on 28% oxygen  . General: Comfortable at this time . Eyes: Grossly normal lids, irises & conjunctiva . ENT: grossly tongue is normal . Neck: no obvious mass . Cardiovascular: S1 S2 normal no gallop . Respiratory:  No rhonchi or rales are noted at this time . Abdomen: soft . Skin: no rash seen on limited exam . Musculoskeletal: not rigid . Psychiatric:unable to assess . Neurologic: no seizure no involuntary movements         Lab Data:   Basic Metabolic Panel: Recent Labs  Lab 08/18/17 1016 08/19/17 1109 08/20/17 0013 08/20/17 0747 08/20/17 1947 08/23/17 0617  NA  --   --   --  142  --  141  K 3.4* 4.5  --  3.2* 4.5 3.5  CL  --   --   --  99  --  97*  CO2  --   --   --  36*  --  37*  GLUCOSE  --   --   --  184*  --  161*  BUN  --   --   --  20  --  17  CREATININE  --   --   --  0.38*  --  0.35*  CALCIUM  --   --   --  9.2  --  8.9  MG  --   --  1.7  --  1.9 1.7  PHOS  --   --   --  3.1  --  3.0    Liver Function Tests: Recent Labs  Lab 08/20/17 0747 08/23/17 0617  AST 17 17  ALT 15 14  ALKPHOS 89 83  BILITOT 0.6 0.6  PROT 5.9* 6.0*  ALBUMIN 2.8* 2.9*   No results for input(s): LIPASE, AMYLASE in the last 168 hours. No results for input(s): AMMONIA in the last 168  hours.  CBC: Recent Labs  Lab 08/20/17 0747  WBC 10.7*  HGB 10.3*  HCT 35.9*  MCV 83.5  PLT PLATELET CLUMPS NOTED ON SMEAR, UNABLE TO ESTIMATE    Cardiac Enzymes: No results for input(s): CKTOTAL, CKMB, CKMBINDEX, TROPONINI in the last 168 hours.  BNP (last 3 results) No results for input(s): BNP in the last 8760 hours.  ProBNP (last 3 results) No results for input(s): PROBNP in the last 8760 hours.  Radiological Exams: Dg Ugi W/water Sol Cm  Result Date: 08/23/2017 CLINICAL DATA:  Possible gastric leak EXAM: WATER SOLUBLE UPPER GI SERIES TECHNIQUE: Single-column upper GI series was performed using water soluble contrast. CONTRAST:  300 cc Omnipaque 300 COMPARISON:  CT scan 08/15/2017 FLUOROSCOPY TIME:  Fluoroscopy Time:  2 minutes, 36 seconds Radiation Exposure Index (if provided by the fluoroscopic device): 58.1 mGy Number of Acquired Spot Images: 1 FINDINGS: Initial KUB demonstrates a nasogastric tube with tip in the stomach  antrum. Morbid obesity noted. Due to the patient's obesity and lack of responsiveness, positioning maneuvers could not be performed and the patient cannot stand. Most of today's exam was performed with patient supine. We also turn the patient slightly LPO. Contrast medium was injected through the patient's nasogastric tube and fills the stomach. The contrast quickly empties into the duodenum and proximal small bowel. There is some flattening along the greater curvature of the stomach corresponding to the site of the prior collection of fluid and gas, for example on image 14/6. I do not observe any leak from the stomach or proximal small bowel. I used paddle compression to flatten the stomach and attempt to better assess for leak, but no well-defined the kick is demonstrated. There episodes of gastroesophageal reflux up to the level of the neck. IMPRESSION: 1. Currently no gastric or proximal small bowel leak is demonstrated, after injection of 300 cc of contrast  medium and CT paddle compression. Contrast quickly extends into the proximal small bowel which is not dilated. Sensitivity may be mildly adversely affected by the lack of our ability to turn and re-position the patient to use gravity maneuvers and encourage filling of all parts of the stomach. 2. Gastroesophageal reflux. 3. There is a slight indentation along the left side of the stomach, probably corresponding to the adjacent collection of gas and fluid shown on CT scan of 08/15/2017. No visualized leak into this collection today. Electronically Signed   By: Van Clines M.D.   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  acute on chronic Respiratory failure with hypoxia will continue with full support on T collar do not think she is able to be decannulated secondary to above on her anatomy and also the fact that she has a severe sleep apnea with therefore continue with the trach in at this time she is not awake enough to actually [place] using BiPAP or CPAP.   2. Lobar pneumonia treated with antibiotics we will continue present management has been treated with antibiotics 3. Panhypopituitarism stable at baseline continue present therapy 4. Pulmonary embolism treated 5. Anoxic encephalopathy shows waxing and waning mental status will continue to monitor 6. Acute renal failure follow labs doing better we will follow along.   I have personally seen and evaluated the patient, evaluated laboratory and imaging results, formulated the assessment and plan and placed orders. The Patient requires high complexity decision making for assessment and support.  Case was discussed on Rounds with the Respiratory Therapy  Staff  Allyne Gee, MD [facility] Pulmonary Critical Care Medicine Sleep Medicine

## 2017-08-25 DIAGNOSIS — J9621 Acute and chronic respiratory failure with hypoxia: Secondary | ICD-10-CM | POA: Diagnosis not present

## 2017-08-25 DIAGNOSIS — G931 Anoxic brain damage, not elsewhere classified: Secondary | ICD-10-CM | POA: Diagnosis not present

## 2017-08-25 DIAGNOSIS — N179 Acute kidney failure, unspecified: Secondary | ICD-10-CM | POA: Diagnosis not present

## 2017-08-25 DIAGNOSIS — I2699 Other pulmonary embolism without acute cor pulmonale: Secondary | ICD-10-CM | POA: Diagnosis not present

## 2017-08-25 DIAGNOSIS — E230 Hypopituitarism: Secondary | ICD-10-CM | POA: Diagnosis not present

## 2017-08-25 DIAGNOSIS — J181 Lobar pneumonia, unspecified organism: Secondary | ICD-10-CM | POA: Diagnosis not present

## 2017-08-25 LAB — MAGNESIUM: Magnesium: 1.7 mg/dL (ref 1.7–2.4)

## 2017-08-25 LAB — PROTIME-INR
INR: 1.89
Prothrombin Time: 21.5 seconds — ABNORMAL HIGH (ref 11.4–15.2)

## 2017-08-25 NOTE — Progress Notes (Signed)
Pulmonary [place]   PULMONARY SERVICE  PROGRESS NOTE  Date of Service: 08/25/2017  Debra Sanford  [id number]:[id number]  DOB: [birth year]   DOA: 07/24/2017  Referring Physician: Merton Border, MD  HPI: Debra Sanford is a 37 y.o. female seen for follow up of Acute on Chronic Respiratory Failure.  Patient is on T collar she is resting comfortably no distress noted at this time.  Currently is on 28% oxygen.  Right now patient is at baseline  Medications: Reviewed on Rounds  Physical Exam:  Vitals: Temperature 97.3 pulse 80 respiratory rate 19 blood pressure 157/80 saturations 98%  Ventilator Settings currently off of the ventilator  . General: Comfortable at this time . Eyes: Grossly normal lids, irises & conjunctiva . ENT: grossly tongue is normal . Neck: no obvious mass . Cardiovascular: S1 S2 normal no gallop . Respiratory: No rhonchi or rales are noted . Abdomen: soft . Skin: no rash seen on limited exam . Musculoskeletal: not rigid . Psychiatric:unable to assess . Neurologic: no seizure no involuntary movements         Lab Data:   Basic Metabolic Panel: Recent Labs  Lab 08/19/17 1109 08/20/17 0013 08/20/17 0747 08/20/17 1947 08/23/17 0617 08/25/17 1120  NA  --   --  142  --  141  --   K 4.5  --  3.2* 4.5 3.5  --   CL  --   --  99  --  97*  --   CO2  --   --  36*  --  37*  --   GLUCOSE  --   --  184*  --  161*  --   BUN  --   --  20  --  17  --   CREATININE  --   --  0.38*  --  0.35*  --   CALCIUM  --   --  9.2  --  8.9  --   MG  --  1.7  --  1.9 1.7 1.7  PHOS  --   --  3.1  --  3.0  --     Liver Function Tests: Recent Labs  Lab 08/20/17 0747 08/23/17 0617  AST 17 17  ALT 15 14  ALKPHOS 89 83  BILITOT 0.6 0.6  PROT 5.9* 6.0*  ALBUMIN 2.8* 2.9*   No results for input(s): LIPASE, AMYLASE in the last 168 hours. No results for input(s): AMMONIA in the last 168 hours.  CBC: Recent Labs  Lab  08/20/17 0747  WBC 10.7*  HGB 10.3*  HCT 35.9*  MCV 83.5  PLT PLATELET CLUMPS NOTED ON SMEAR, UNABLE TO ESTIMATE    Cardiac Enzymes: No results for input(s): CKTOTAL, CKMB, CKMBINDEX, TROPONINI in the last 168 hours.  BNP (last 3 results) No results for input(s): BNP in the last 8760 hours.  ProBNP (last 3 results) No results for input(s): PROBNP in the last 8760 hours.  Radiological Exams: No results found.  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 Acute on chronic respiratory failure with hypoxia we will continue with full supportive care patient remains on T collar and this will be her baseline she has failed attempts at capping. 2. Lobar pneumonia treated with antibiotics we  will continue present management. 3. Panhypopituitarism continue with supportive care prognosis is guarded. 4. Acute renal failure we will follow along 5. Chronic anoxic encephalopathy stable 6. Pulmonary embolism at baseline   I have personally seen and evaluated the patient, evaluated laboratory and imaging results, formulated the assessment and plan and placed orders. The Patient requires high complexity decision making for assessment and support.  Case was discussed on Rounds with the Respiratory Therapy Staff  Allyne Gee, MD [facility] Pulmonary Critical Care Medicine Sleep Medicine

## 2017-08-26 DIAGNOSIS — J9621 Acute and chronic respiratory failure with hypoxia: Secondary | ICD-10-CM | POA: Diagnosis not present

## 2017-08-26 DIAGNOSIS — J181 Lobar pneumonia, unspecified organism: Secondary | ICD-10-CM | POA: Diagnosis not present

## 2017-08-26 DIAGNOSIS — G931 Anoxic brain damage, not elsewhere classified: Secondary | ICD-10-CM | POA: Diagnosis not present

## 2017-08-26 DIAGNOSIS — N179 Acute kidney failure, unspecified: Secondary | ICD-10-CM | POA: Diagnosis not present

## 2017-08-26 LAB — COMPREHENSIVE METABOLIC PANEL
ALBUMIN: 2.6 g/dL — AB (ref 3.5–5.0)
ALK PHOS: 82 U/L (ref 38–126)
ALT: 13 U/L (ref 0–44)
AST: 18 U/L (ref 15–41)
Anion gap: 5 (ref 5–15)
BILIRUBIN TOTAL: 0.6 mg/dL (ref 0.3–1.2)
BUN: 15 mg/dL (ref 6–20)
CALCIUM: 8.4 mg/dL — AB (ref 8.9–10.3)
CO2: 36 mmol/L — ABNORMAL HIGH (ref 22–32)
Chloride: 99 mmol/L (ref 98–111)
Creatinine, Ser: 0.42 mg/dL — ABNORMAL LOW (ref 0.44–1.00)
GFR calc Af Amer: >60 mL/min (ref >60)
GFR calc non Af Amer: >60 mL/min (ref >60)
GLUCOSE: 102 mg/dL — AB (ref 70–99)
Potassium: 2.2 mmol/L — CL (ref 3.5–5.1)
Sodium: 140 mmol/L (ref 135–145)
TOTAL PROTEIN: 5.5 g/dL — AB (ref 6.5–8.1)

## 2017-08-26 LAB — PROTIME-INR
INR: 2.85
Prothrombin Time: 29.7 seconds — ABNORMAL HIGH (ref 11.4–15.2)

## 2017-08-26 LAB — PHOSPHORUS: Phosphorus: 3.3 mg/dL (ref 2.5–4.6)

## 2017-08-26 LAB — TRIGLYCERIDES: Triglycerides: 71 mg/dL (ref <150)

## 2017-08-26 LAB — POTASSIUM: Potassium: 3.3 mmol/L — ABNORMAL LOW (ref 3.5–5.1)

## 2017-08-26 LAB — MAGNESIUM: Magnesium: 2 mg/dL (ref 1.7–2.4)

## 2017-08-26 NOTE — Progress Notes (Signed)
Pulmonary [place]   PULMONARY SERVICE  PROGRESS NOTE  Date of Service: 08/26/2017  Debra Sanford  [id number]  DOB: 1980-06-19   DOA: 07/24/2017  Referring Physician: Merton Border, MD  HPI: Debra Sanford is a 37 y.o. female seen for follow up of Acute on Chronic Respiratory Failure.  Currently patient's on T collar has been on 28% oxygen without distress.  Patient is at baseline  Medications: Reviewed on Rounds  Physical Exam:  Vitals: Temperature 96.7 pulse 95 respiratory 19 blood pressure 164/97 saturations 99%  Ventilator Settings off the ventilator on T collar currently on 28% FiO2  . General: Comfortable at this time . Eyes: Grossly normal lids, irises & conjunctiva . ENT: grossly tongue is normal . Neck: no obvious mass . Cardiovascular: S1 S2 normal no gallop . Respiratory: No rhonchi or rales . Abdomen: soft . Skin: no rash seen on limited exam . Musculoskeletal: not rigid . Psychiatric:unable to assess . Neurologic: no seizure no involuntary movements         Lab Data:   Basic Metabolic Panel: Recent Labs  Lab 08/20/17 0013 08/20/17 0747 08/20/17 1947 08/23/17 0617 08/25/17 1120 08/26/17 0106 08/26/17 0634  NA  --  142  --  141  --   --  140  K  --  3.2* 4.5 3.5  --   --  2.2*  CL  --  99  --  97*  --   --  99  CO2  --  36*  --  37*  --   --  36*  GLUCOSE  --  184*  --  161*  --   --  102*  BUN  --  20  --  17  --   --  15  CREATININE  --  0.38*  --  0.35*  --   --  0.42*  CALCIUM  --  9.2  --  8.9  --   --  8.4*  MG 1.7  --  1.9 1.7 1.7 2.0  --   PHOS  --  3.1  --  3.0  --   --  3.3    Liver Function Tests: Recent Labs  Lab 08/20/17 0747 08/23/17 0617 08/26/17 0634  AST 17 17 18   ALT 15 14 13   ALKPHOS 89 83 82  BILITOT 0.6 0.6 0.6  PROT 5.9* 6.0* 5.5*  ALBUMIN 2.8* 2.9* 2.6*   No results for input(s): LIPASE, AMYLASE in the last 168 hours. No results for input(s): AMMONIA  in the last 168 hours.  CBC: Recent Labs  Lab 08/20/17 0747  WBC 10.7*  HGB 10.3*  HCT 35.9*  MCV 83.5  PLT PLATELET CLUMPS NOTED ON SMEAR, UNABLE TO ESTIMATE    Cardiac Enzymes: No results for input(s): CKTOTAL, CKMB, CKMBINDEX, TROPONINI in the last 168 hours.  BNP (last 3 results) No results for input(s): BNP in the last 8760 hours.  ProBNP (last 3 results) No results for input(s): PROBNP in the last 8760 hours.  Radiological Exams: No results found.  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 Acute on chronic respiratory failure with hypoxia continue with T collar trials continue secretion management pulmonary toilet overall patient prognosis is guarded. 2. Lobar pneumonia  treated and resolved 3. Panhypopituitarism at baseline 4. Acute renal failure resolved 5. Chronic anoxic encephalopathy she is improving 6. Pulmonary embolism treated   I have personally seen and evaluated the patient, evaluated laboratory and imaging results, formulated the assessment and plan and placed orders. The Patient requires high complexity decision making for assessment and support.  Case was discussed on Rounds with the Respiratory Therapy Staff  Allyne Gee, MD [facility] Pulmonary Critical Care Medicine Sleep Medicine

## 2017-08-27 ENCOUNTER — Other Ambulatory Visit (HOSPITAL_COMMUNITY): Payer: Medicaid Other

## 2017-08-27 DIAGNOSIS — N179 Acute kidney failure, unspecified: Secondary | ICD-10-CM | POA: Diagnosis not present

## 2017-08-27 DIAGNOSIS — J181 Lobar pneumonia, unspecified organism: Secondary | ICD-10-CM | POA: Diagnosis not present

## 2017-08-27 DIAGNOSIS — G931 Anoxic brain damage, not elsewhere classified: Secondary | ICD-10-CM | POA: Diagnosis not present

## 2017-08-27 DIAGNOSIS — J9621 Acute and chronic respiratory failure with hypoxia: Secondary | ICD-10-CM | POA: Diagnosis not present

## 2017-08-27 LAB — PROTIME-INR
INR: 2.54
Prothrombin Time: 27.1 seconds — ABNORMAL HIGH (ref 11.4–15.2)

## 2017-08-27 LAB — POTASSIUM: Potassium: 3.4 mmol/L — ABNORMAL LOW (ref 3.5–5.1)

## 2017-08-27 MED ORDER — IOHEXOL 300 MG/ML  SOLN
100.0000 mL | Freq: Once | INTRAMUSCULAR | Status: AC | PRN
  Administered 2017-08-27: 100 mL via INTRAVENOUS

## 2017-08-27 NOTE — Progress Notes (Signed)
Pulmonary [place]   PULMONARY SERVICE  PROGRESS NOTE  Date of Service: 08/27/2017  Debra Sanford  [id number]  DOB: [birth year]   DOA: 07/24/2017  Referring Physician: Merton Border, MD  HPI: Debra Sanford is a 37 y.o. female seen for follow up of Acute on Chronic Respiratory Failure.  Right now is afebrile without distress.  Patient is on T collar currently 28% oxygen  Medications: Reviewed on Rounds  Physical Exam:  Vitals:  Temperature 97.2 degrees pulse 93 respiratory rate 16 blood pressure 156/90 saturations 93%  Ventilator Settings  Off of the ventilator on T collar trials at this time  . General: Comfortable at this time . Eyes: Grossly normal lids, irises & conjunctiva . ENT: grossly tongue is normal . Neck: no obvious mass . Cardiovascular: S1 S2 normal no gallop . Respiratory:  No rhonchi are noted . Abdomen: soft . Skin: no rash seen on limited exam . Musculoskeletal: not rigid . Psychiatric:unable to assess . Neurologic: no seizure no involuntary movements         Lab Data:   Basic Metabolic Panel: Recent Labs  Lab 08/20/17 1947 08/23/17 0617 08/25/17 1120 08/26/17 0106 08/26/17 0634 08/26/17 1711 08/27/17 0546  NA  --  141  --   --  140  --   --   K 4.5 3.5  --   --  2.2* 3.3* 3.4*  CL  --  97*  --   --  99  --   --   CO2  --  37*  --   --  36*  --   --   GLUCOSE  --  161*  --   --  102*  --   --   BUN  --  17  --   --  15  --   --   CREATININE  --  0.35*  --   --  0.42*  --   --   CALCIUM  --  8.9  --   --  8.4*  --   --   MG 1.9 1.7 1.7 2.0  --   --   --   PHOS  --  3.0  --   --  3.3  --   --     Liver Function Tests: Recent Labs  Lab 08/23/17 0617 08/26/17 0634  AST 17 18  ALT 14 13  ALKPHOS 83 82  BILITOT 0.6 0.6  PROT 6.0* 5.5*  ALBUMIN 2.9* 2.6*   No results for input(s): LIPASE, AMYLASE in the last 168 hours. No results for input(s): AMMONIA in the last 168  hours.  CBC: No results for input(s): WBC, NEUTROABS, HGB, HCT, MCV, PLT in the last 168 hours.  Cardiac Enzymes: No results for input(s): CKTOTAL, CKMB, CKMBINDEX, TROPONINI in the last 168 hours.  BNP (last 3 results) No results for input(s): BNP in the last 8760 hours.  ProBNP (last 3 results) No results for input(s): PROBNP in the last 8760 hours.  Radiological Exams: Ct Abdomen Pelvis W Contrast  Result Date: 08/27/2017 CLINICAL DATA:  Abdominal abscess. EXAM: CT ABDOMEN AND PELVIS WITH CONTRAST TECHNIQUE: Multidetector CT imaging of the abdomen and pelvis was performed using the standard protocol following bolus administration of intravenous contrast. CONTRAST:  137mL OMNIPAQUE IOHEXOL 300 MG/ML  SOLN COMPARISON:  CT scan of August 15, 2017. FINDINGS: Lower chest: Mild left pleural effusion with adjacent subsegmental atelectasis. Mild right basilar subsegmental atelectasis is noted as well.  Hepatobiliary: No focal liver abnormality is seen. No gallstones, gallbladder wall thickening, or biliary dilatation. Pancreas: Unremarkable. No pancreatic ductal dilatation or surrounding inflammatory changes. Spleen: Stable mild fluid collection is noted around the spleen. Adrenals/Urinary Tract: Adrenal glands are unremarkable. Kidneys are normal, without renal calculi, focal lesion, or hydronephrosis. Bladder is unremarkable. Stomach/Bowel: Nasogastric tube tip is seen in distal stomach. There is no evidence of bowel obstruction or inflammation. 4.2 x 3.5 cm fluid collection is seen along the greater curvature of the body of the stomach which is not significantly changed compared to prior exam. Vascular/Lymphatic: No significant vascular findings are present. No enlarged abdominal or pelvic lymph nodes. Reproductive: Uterus and bilateral adnexa are unremarkable. Other: Amount of pneumoperitoneum identified is significantly smaller compared to prior exam. 6.8 x 3.0 cm fluid collection is seen in left side  of the abdomen along anterior abdominal wall which is not significantly changed compared to prior exam. Musculoskeletal: No acute or significant osseous findings. IMPRESSION: Pneumoperitoneum noted on prior exam is significantly smaller currently. 6.8 x 3.0 cm fluid collection is seen along the anterior wall of the left side of the abdomen which is not significantly changed compared to prior exam. Grossly stable 4.2 x 3.5 cm fluid collection is noted along greater curvature of body of stomach. Mild left pleural effusion is noted with adjacent subsegmental atelectasis. Electronically Signed   By: Marijo Conception, M.D.   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  acute on chronic Respiratory failure with hypoxia will continue with full support on T collar patient is at baseline she is not able to be decannulated.   2. Lobar pneumonia treated with antibiotics.  In addition she had a CT of the abdomen done which showed improving pneumoperitoneum and mild left-sided pleural effusion with some subsegmental atelectasis need to continue to monitor this closely.   3. Panhypopituitarism remains stable we will continue present management.  Acute renal failure stable at this time continue with present therapy.   4. Chronic anoxic encephalopathy clinically improving will monitor 5. Pulmonary embolism treated will follow along   I have personally seen and evaluated the patient, evaluated laboratory and imaging results, formulated the assessment and plan and placed orders. The Patient requires high complexity decision making for assessment and support.  Case was discussed on Rounds with the Respiratory Therapy Staff  Allyne Gee,  MD [facility] Pulmonary Critical Care Medicine Sleep Medicine

## 2017-08-28 DIAGNOSIS — J181 Lobar pneumonia, unspecified organism: Secondary | ICD-10-CM | POA: Diagnosis not present

## 2017-08-28 DIAGNOSIS — G931 Anoxic brain damage, not elsewhere classified: Secondary | ICD-10-CM | POA: Diagnosis not present

## 2017-08-28 DIAGNOSIS — N179 Acute kidney failure, unspecified: Secondary | ICD-10-CM | POA: Diagnosis not present

## 2017-08-28 DIAGNOSIS — J9621 Acute and chronic respiratory failure with hypoxia: Secondary | ICD-10-CM | POA: Diagnosis not present

## 2017-08-28 LAB — PROTIME-INR
INR: 2.02
PROTHROMBIN TIME: 22.7 s — AB (ref 11.4–15.2)

## 2017-08-28 NOTE — Progress Notes (Signed)
Pulmonary [place]   PULMONARY SERVICE  PROGRESS NOTE  Date of Service: 08/28/2017  Debra Sanford  [id number]  DOB: February 12, 1981   DOA: 07/24/2017  Referring Physician: Merton Border, MD  HPI: Debra Sanford is a 37 y.o. female seen for follow up of Acute on Chronic Respiratory Failure.  Patient is on T collar doing well we will continue supportive care on the T collar  Medications: Reviewed on Rounds  Physical Exam:  Vitals: Temperature 96.8 pulse 70 respiratory rate 18 blood pressure 134/82 saturations 99%  Ventilator Settings currently on T collar trials FiO2 28%  . General: Comfortable at this time . Eyes: Grossly normal lids, irises & conjunctiva . ENT: grossly tongue is normal . Neck: no obvious mass . Cardiovascular: S1 S2 normal no gallop . Respiratory: No rhonchi or rales are noted . Abdomen: soft . Skin: no rash seen on limited exam . Musculoskeletal: not rigid . Psychiatric:unable to assess . Neurologic: no seizure no involuntary movements         Lab Data:   Basic Metabolic Panel: Recent Labs  Lab 08/23/17 0617 08/25/17 1120 08/26/17 0106 08/26/17 0634 08/26/17 1711 08/27/17 0546  NA 141  --   --  140  --   --   K 3.5  --   --  2.2* 3.3* 3.4*  CL 97*  --   --  99  --   --   CO2 37*  --   --  36*  --   --   GLUCOSE 161*  --   --  102*  --   --   BUN 17  --   --  15  --   --   CREATININE 0.35*  --   --  0.42*  --   --   CALCIUM 8.9  --   --  8.4*  --   --   MG 1.7 1.7 2.0  --   --   --   PHOS 3.0  --   --  3.3  --   --     Liver Function Tests: Recent Labs  Lab 08/23/17 0617 08/26/17 0634  AST 17 18  ALT 14 13  ALKPHOS 83 82  BILITOT 0.6 0.6  PROT 6.0* 5.5*  ALBUMIN 2.9* 2.6*   No results for input(s): LIPASE, AMYLASE in the last 168 hours. No results for input(s): AMMONIA in the last 168 hours.  CBC: No results for input(s): WBC, NEUTROABS, HGB, HCT, MCV, PLT in the last 168  hours.  Cardiac Enzymes: No results for input(s): CKTOTAL, CKMB, CKMBINDEX, TROPONINI in the last 168 hours.  BNP (last 3 results) No results for input(s): BNP in the last 8760 hours.  ProBNP (last 3 results) No results for input(s): PROBNP in the last 8760 hours.  Radiological Exams: Ct Abdomen Pelvis W Contrast  Result Date: 08/27/2017 CLINICAL DATA:  Abdominal abscess. EXAM: CT ABDOMEN AND PELVIS WITH CONTRAST TECHNIQUE: Multidetector CT imaging of the abdomen and pelvis was performed using the standard protocol following bolus administration of intravenous contrast. CONTRAST:  192mL OMNIPAQUE IOHEXOL 300 MG/ML  SOLN COMPARISON:  CT scan of August 15, 2017. FINDINGS: Lower chest: Mild left pleural effusion with adjacent subsegmental atelectasis. Mild right basilar subsegmental atelectasis is noted as well. Hepatobiliary: No focal liver abnormality is seen. No gallstones, gallbladder wall thickening, or biliary dilatation. Pancreas: Unremarkable. No pancreatic ductal dilatation or surrounding inflammatory changes. Spleen: Stable mild fluid collection is noted around  the spleen. Adrenals/Urinary Tract: Adrenal glands are unremarkable. Kidneys are normal, without renal calculi, focal lesion, or hydronephrosis. Bladder is unremarkable. Stomach/Bowel: Nasogastric tube tip is seen in distal stomach. There is no evidence of bowel obstruction or inflammation. 4.2 x 3.5 cm fluid collection is seen along the greater curvature of the body of the stomach which is not significantly changed compared to prior exam. Vascular/Lymphatic: No significant vascular findings are present. No enlarged abdominal or pelvic lymph nodes. Reproductive: Uterus and bilateral adnexa are unremarkable. Other: Amount of pneumoperitoneum identified is significantly smaller compared to prior exam. 6.8 x 3.0 cm fluid collection is seen in left side of the abdomen along anterior abdominal wall which is not significantly changed compared to  prior exam. Musculoskeletal: No acute or significant osseous findings. IMPRESSION: Pneumoperitoneum noted on prior exam is significantly smaller currently. 6.8 x 3.0 cm fluid collection is seen along the anterior wall of the left side of the abdomen which is not significantly changed compared to prior exam. Grossly stable 4.2 x 3.5 cm fluid collection is noted along greater curvature of body of stomach. Mild left pleural effusion is noted with adjacent subsegmental atelectasis. Electronically Signed   By: Marijo Conception, M.D.   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 Acute on chronic respiratory failure with hypoxia we will continue with weaning on T collar continue pulmonary toilet supportive care patient prognosis remains guarded. 2. Lobar pneumonia treated with antibiotics we will continue to follow 3. Panhypopituitarism at baseline 4. Chronic anoxic encephalopathy clinically improving 5. Pulmonary embolism 6. We will continue with supportive care   I have personally seen and evaluated the patient, evaluated laboratory and imaging results, formulated the assessment and plan and placed orders. The Patient requires high complexity decision making for assessment and support.  Case was discussed on Rounds with the Respiratory Therapy Staff  Allyne Gee, MD [facility] Pulmonary Critical Care Medicine Sleep Medicine

## 2017-08-29 LAB — COMPREHENSIVE METABOLIC PANEL
ALBUMIN: 2.7 g/dL — AB (ref 3.5–5.0)
ALT: 13 U/L (ref 0–44)
AST: 20 U/L (ref 15–41)
Alkaline Phosphatase: 93 U/L (ref 38–126)
Anion gap: 7 (ref 5–15)
BILIRUBIN TOTAL: 0.6 mg/dL (ref 0.3–1.2)
BUN: 14 mg/dL (ref 6–20)
CALCIUM: 9 mg/dL (ref 8.9–10.3)
CO2: 33 mmol/L — AB (ref 22–32)
CREATININE: 0.39 mg/dL — AB (ref 0.44–1.00)
Chloride: 98 mmol/L (ref 98–111)
GFR calc Af Amer: >60 mL/min (ref >60)
GFR calc non Af Amer: >60 mL/min (ref >60)
GLUCOSE: 95 mg/dL (ref 70–99)
Potassium: 3.3 mmol/L — ABNORMAL LOW (ref 3.5–5.1)
SODIUM: 138 mmol/L (ref 135–145)
Total Protein: 5.7 g/dL — ABNORMAL LOW (ref 6.5–8.1)

## 2017-08-29 LAB — PROTIME-INR
INR: 2.15
Prothrombin Time: 23.9 seconds — ABNORMAL HIGH (ref 11.4–15.2)

## 2017-08-29 LAB — PHOSPHORUS: Phosphorus: 2.7 mg/dL (ref 2.5–4.6)

## 2017-08-29 LAB — TRIGLYCERIDES: Triglycerides: 129 mg/dL (ref <150)

## 2017-08-29 LAB — MAGNESIUM: Magnesium: 1.6 mg/dL — ABNORMAL LOW (ref 1.7–2.4)

## 2017-08-30 LAB — PROTIME-INR
INR: 1.72
PROTHROMBIN TIME: 20 s — AB (ref 11.4–15.2)

## 2017-08-31 LAB — PROTIME-INR
INR: 1.95
PROTHROMBIN TIME: 22.1 s — AB (ref 11.4–15.2)

## 2017-09-01 LAB — COMPREHENSIVE METABOLIC PANEL
ALBUMIN: 2.9 g/dL — AB (ref 3.5–5.0)
ALK PHOS: 91 U/L (ref 38–126)
ALT: 12 U/L (ref 0–44)
ANION GAP: 7 (ref 5–15)
AST: 20 U/L (ref 15–41)
BILIRUBIN TOTAL: 0.7 mg/dL (ref 0.3–1.2)
BUN: 16 mg/dL (ref 6–20)
CALCIUM: 8.8 mg/dL — AB (ref 8.9–10.3)
CO2: 27 mmol/L (ref 22–32)
CREATININE: 0.36 mg/dL — AB (ref 0.44–1.00)
Chloride: 103 mmol/L (ref 98–111)
GFR calc Af Amer: >60 mL/min (ref >60)
GFR calc non Af Amer: >60 mL/min (ref >60)
GLUCOSE: 85 mg/dL (ref 70–99)
Potassium: 3.6 mmol/L (ref 3.5–5.1)
Sodium: 137 mmol/L (ref 135–145)
Total Protein: 6.4 g/dL — ABNORMAL LOW (ref 6.5–8.1)

## 2017-09-01 LAB — PROTIME-INR
INR: 1.71
Prothrombin Time: 20 seconds — ABNORMAL HIGH (ref 11.4–15.2)

## 2017-09-01 LAB — MAGNESIUM: MAGNESIUM: 1.8 mg/dL (ref 1.7–2.4)

## 2017-09-01 LAB — TRIGLYCERIDES: Triglycerides: 111 mg/dL (ref <150)

## 2017-09-01 LAB — PHOSPHORUS: Phosphorus: 3.3 mg/dL (ref 2.5–4.6)

## 2017-09-02 DIAGNOSIS — J9621 Acute and chronic respiratory failure with hypoxia: Secondary | ICD-10-CM | POA: Diagnosis not present

## 2017-09-02 DIAGNOSIS — N179 Acute kidney failure, unspecified: Secondary | ICD-10-CM | POA: Diagnosis not present

## 2017-09-02 DIAGNOSIS — E230 Hypopituitarism: Secondary | ICD-10-CM | POA: Diagnosis not present

## 2017-09-02 DIAGNOSIS — G931 Anoxic brain damage, not elsewhere classified: Secondary | ICD-10-CM | POA: Diagnosis not present

## 2017-09-02 DIAGNOSIS — J181 Lobar pneumonia, unspecified organism: Secondary | ICD-10-CM | POA: Diagnosis not present

## 2017-09-02 DIAGNOSIS — I2699 Other pulmonary embolism without acute cor pulmonale: Secondary | ICD-10-CM | POA: Diagnosis not present

## 2017-09-02 LAB — PROTIME-INR
INR: 2.02
Prothrombin Time: 22.7 seconds — ABNORMAL HIGH (ref 11.4–15.2)

## 2017-09-02 NOTE — Progress Notes (Signed)
Pulmonary [place]   PULMONARY SERVICE  PROGRESS NOTE  Date of Service: 09/02/2017  Debra Sanford  ZOX:[id number]  DOB: 09-27-1980   DOA: 07/24/2017  Referring Physician: Merton Border, MD  HPI: Debra Sanford is a 37 y.o. female seen for follow up of Acute on Chronic Respiratory Failure.  She is on T collar has been on 28% oxygen comfortable without distress.  Medications: Reviewed on Rounds  Physical Exam:  Vitals: Temperature 97.6 pulse 77 respiratory 22 blood pressure 125/78 saturation 96%  Ventilator Settings currently on T collar 28% FiO2  . General: Comfortable at this time . Eyes: Grossly normal lids, irises & conjunctiva . ENT: grossly tongue is normal . Neck: no obvious mass . Cardiovascular: S1 S2 normal no gallop . Respiratory: No rhonchi or rales . Abdomen: soft . Skin: no rash seen on limited exam . Musculoskeletal: not rigid . Psychiatric:unable to assess . Neurologic: no seizure no involuntary movements         Lab Data:   Basic Metabolic Panel: Recent Labs  Lab 08/26/17 1711 08/27/17 0546 08/29/17 0011 08/29/17 0517 08/31/17 2347 09/01/17 0817  NA  --   --   --  138  --  137  K 3.3* 3.4*  --  3.3*  --  3.6  CL  --   --   --  98  --  103  CO2  --   --   --  33*  --  27  GLUCOSE  --   --   --  95  --  85  BUN  --   --   --  14  --  16  CREATININE  --   --   --  0.39*  --  0.36*  CALCIUM  --   --   --  9.0  --  8.8*  MG  --   --  1.6*  --  1.8  --   PHOS  --   --   --  2.7  --  3.3    Liver Function Tests: Recent Labs  Lab 08/29/17 0517 09/01/17 0817  AST 20 20  ALT 13 12  ALKPHOS 93 91  BILITOT 0.6 0.7  PROT 5.7* 6.4*  ALBUMIN 2.7* 2.9*   No results for input(s): LIPASE, AMYLASE in the last 168 hours. No results for input(s): AMMONIA in the last 168 hours.  CBC: No results for input(s): WBC, NEUTROABS, HGB, HCT, MCV, PLT in the last 168 hours.  Cardiac Enzymes: No  results for input(s): CKTOTAL, CKMB, CKMBINDEX, TROPONINI in the last 168 hours.  BNP (last 3 results) No results for input(s): BNP in the last 8760 hours.  ProBNP (last 3 results) No results for input(s): PROBNP in the last 8760 hours.  Radiological Exams: No results found.  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 Acute on chronic respiratory failure with hypoxia we will continue with T collar try to use the PMV if patient is able to tolerate if she can during the day at least.  Do not think she is to be able to decannulate secondary to her morbid obesity and sleep apnea issues. 2. Lobar pneumonia treated we will continue present management 3. Panhypopituitarism she is at  baseline 4. Chronic anoxic encephalopathy stable 5. Pulmonary embolism treated   I have personally seen and evaluated the patient, evaluated laboratory and imaging results, formulated the assessment and plan and placed orders. The Patient requires high complexity decision making for assessment and support.  Case was discussed on Rounds with the Respiratory Therapy Staff  Allyne Gee, MD [facility] Pulmonary Critical Care Medicine Sleep Medicine

## 2017-09-03 DIAGNOSIS — J181 Lobar pneumonia, unspecified organism: Secondary | ICD-10-CM | POA: Diagnosis not present

## 2017-09-03 DIAGNOSIS — N179 Acute kidney failure, unspecified: Secondary | ICD-10-CM | POA: Diagnosis not present

## 2017-09-03 DIAGNOSIS — G931 Anoxic brain damage, not elsewhere classified: Secondary | ICD-10-CM | POA: Diagnosis not present

## 2017-09-03 DIAGNOSIS — J9621 Acute and chronic respiratory failure with hypoxia: Secondary | ICD-10-CM | POA: Diagnosis not present

## 2017-09-03 LAB — PROTIME-INR
INR: 2.12
Prothrombin Time: 23.6 seconds — ABNORMAL HIGH (ref 11.4–15.2)

## 2017-09-03 NOTE — Progress Notes (Signed)
Pulmonary [place]   PULMONARY SERVICE  PROGRESS NOTE  Date of Service: 09/03/2017  Debra Sanford  GUR:[id number]  DOB: January 20, 1981   DOA: 07/24/2017  Referring Physician: Merton Border, MD  HPI: Debra Sanford is a 37 y.o. female seen for follow up of Acute on Chronic Respiratory Failure.  She is on T collar was attempted at capping however failed  Medications: Reviewed on Rounds  Physical Exam:  Vitals: Temperature 98.3 pulse 105 respiratory 22 blood pressure 133/83 saturations 97%  Ventilator Settings off the ventilator on T collar  . General: Comfortable at this time . Eyes: Grossly normal lids, irises & conjunctiva . ENT: grossly tongue is normal . Neck: no obvious mass . Cardiovascular: S1 S2 normal no gallop . Respiratory: Coarse breath sounds noted . Abdomen: soft . Skin: no rash seen on limited exam . Musculoskeletal: not rigid . Psychiatric:unable to assess . Neurologic: no seizure no involuntary movements         Lab Data:   Basic Metabolic Panel: Recent Labs  Lab 08/29/17 0011 08/29/17 0517 08/31/17 2347 09/01/17 0817  NA  --  138  --  137  K  --  3.3*  --  3.6  CL  --  98  --  103  CO2  --  33*  --  27  GLUCOSE  --  95  --  85  BUN  --  14  --  16  CREATININE  --  0.39*  --  0.36*  CALCIUM  --  9.0  --  8.8*  MG 1.6*  --  1.8  --   PHOS  --  2.7  --  3.3    Liver Function Tests: Recent Labs  Lab 08/29/17 0517 09/01/17 0817  AST 20 20  ALT 13 12  ALKPHOS 93 91  BILITOT 0.6 0.7  PROT 5.7* 6.4*  ALBUMIN 2.7* 2.9*   No results for input(s): LIPASE, AMYLASE in the last 168 hours. No results for input(s): AMMONIA in the last 168 hours.  CBC: No results for input(s): WBC, NEUTROABS, HGB, HCT, MCV, PLT in the last 168 hours.  Cardiac Enzymes: No results for input(s): CKTOTAL, CKMB, CKMBINDEX, TROPONINI in the last 168 hours.  BNP (last 3 results) No results for input(s): BNP in  the last 8760 hours.  ProBNP (last 3 results) No results for input(s): PROBNP in the last 8760 hours.  Radiological Exams: No results found.  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 Acute on chronic respiratory failure with hypoxia we will continue with full support on T collar no capping to be done. 2. Lobar pneumonia treated with antibiotics we will follow 3. Panhypopituitarism she is at baseline 4. Acute labs are stable 5. Chronic anoxic encephalopathy unchanged 6. Pulmonary embolism treated   I have personally seen and evaluated the patient, evaluated laboratory and imaging results, formulated the assessment and plan and placed orders. The Patient requires high complexity decision making for assessment and support.  Case was discussed on Rounds with the Respiratory Therapy Staff  Allyne Gee, MD [facility] Pulmonary Critical Care Medicine Sleep Medicine

## 2017-09-04 ENCOUNTER — Other Ambulatory Visit (HOSPITAL_COMMUNITY): Payer: Medicaid Other

## 2017-09-04 DIAGNOSIS — G931 Anoxic brain damage, not elsewhere classified: Secondary | ICD-10-CM | POA: Diagnosis not present

## 2017-09-04 DIAGNOSIS — E230 Hypopituitarism: Secondary | ICD-10-CM | POA: Diagnosis not present

## 2017-09-04 DIAGNOSIS — N179 Acute kidney failure, unspecified: Secondary | ICD-10-CM | POA: Diagnosis not present

## 2017-09-04 DIAGNOSIS — J181 Lobar pneumonia, unspecified organism: Secondary | ICD-10-CM | POA: Diagnosis not present

## 2017-09-04 DIAGNOSIS — I2699 Other pulmonary embolism without acute cor pulmonale: Secondary | ICD-10-CM | POA: Diagnosis not present

## 2017-09-04 DIAGNOSIS — J9621 Acute and chronic respiratory failure with hypoxia: Secondary | ICD-10-CM | POA: Diagnosis not present

## 2017-09-04 LAB — COMPREHENSIVE METABOLIC PANEL
ALK PHOS: 99 U/L (ref 38–126)
ALT: 15 U/L (ref 0–44)
AST: 28 U/L (ref 15–41)
Albumin: 3 g/dL — ABNORMAL LOW (ref 3.5–5.0)
Anion gap: 11 (ref 5–15)
BILIRUBIN TOTAL: 0.7 mg/dL (ref 0.3–1.2)
BUN: 17 mg/dL (ref 6–20)
CALCIUM: 9.2 mg/dL (ref 8.9–10.3)
CO2: 27 mmol/L (ref 22–32)
Chloride: 98 mmol/L (ref 98–111)
Creatinine, Ser: 0.37 mg/dL — ABNORMAL LOW (ref 0.44–1.00)
GLUCOSE: 85 mg/dL (ref 70–99)
POTASSIUM: 4.2 mmol/L (ref 3.5–5.1)
Sodium: 136 mmol/L (ref 135–145)
TOTAL PROTEIN: 6.3 g/dL — AB (ref 6.5–8.1)

## 2017-09-04 LAB — MAGNESIUM: Magnesium: 1.9 mg/dL (ref 1.7–2.4)

## 2017-09-04 LAB — PROTIME-INR
INR: 2.05
PROTHROMBIN TIME: 23 s — AB (ref 11.4–15.2)

## 2017-09-04 LAB — PHOSPHORUS: Phosphorus: 3.3 mg/dL (ref 2.5–4.6)

## 2017-09-04 LAB — TRIGLYCERIDES: TRIGLYCERIDES: 108 mg/dL (ref <150)

## 2017-09-04 MED ORDER — IOHEXOL 300 MG/ML  SOLN
100.0000 mL | Freq: Once | INTRAMUSCULAR | Status: AC | PRN
  Administered 2017-09-04: 100 mL via INTRAVENOUS

## 2017-09-04 NOTE — Progress Notes (Signed)
Pulmonary [place]   PULMONARY SERVICE  PROGRESS NOTE  Date of Service: 09/04/2017  Debra Sanford  DOB: 09/25/1980   DOA: 07/24/2017  Referring Physician: Merton Border, MD  HPI: Debra Sanford is a 37 y.o. female seen for follow up of Acute on Chronic Respiratory Failure.  Patient remains on T collar is comfortable without distress has been on 28% FiO2.  Medications: Reviewed on Rounds  Physical Exam:  Vitals: Temperature 97.7 pulse 88 respiratory rate 12 blood pressure 149/77 saturation 98%  Ventilator Settings off the ventilator on T collar  . General: Comfortable at this time . Eyes: Grossly normal lids, irises & conjunctiva . ENT: grossly tongue is normal . Neck: no obvious mass . Cardiovascular: S1 S2 normal no gallop . Respiratory: Coarse breath sounds noted bilaterally . Abdomen: soft . Skin: no rash seen on limited exam . Musculoskeletal: not rigid . Psychiatric:unable to assess . Neurologic: no seizure no involuntary movements         Lab Data:   Basic Metabolic Panel: Recent Labs  Lab 08/29/17 0011 08/29/17 0517 08/31/17 2347 09/01/17 0817 09/04/17 0723  NA  --  138  --  137 136  K  --  3.3*  --  3.6 4.2  CL  --  98  --  103 98  CO2  --  33*  --  27 27  GLUCOSE  --  95  --  85 85  BUN  --  14  --  16 17  CREATININE  --  0.39*  --  0.36* 0.37*  CALCIUM  --  9.0  --  8.8* 9.2  MG 1.6*  --  1.8  --  1.9  PHOS  --  2.7  --  3.3 3.3    Liver Function Tests: Recent Labs  Lab 08/29/17 0517 09/01/17 0817 09/04/17 0723  AST 20 20 28   ALT 13 12 15   ALKPHOS 93 91 99  BILITOT 0.6 0.7 0.7  PROT 5.7* 6.4* 6.3*  ALBUMIN 2.7* 2.9* 3.0*   No results for input(s): LIPASE, AMYLASE in the last 168 hours. No results for input(s): AMMONIA in the last 168 hours.  CBC: No results for input(s): WBC, NEUTROABS, HGB, HCT, MCV, PLT in the last 168 hours.  Cardiac Enzymes: No results  for input(s): CKTOTAL, CKMB, CKMBINDEX, TROPONINI in the last 168 hours.  BNP (last 3 results) No results for input(s): BNP in the last 8760 hours.  ProBNP (last 3 results) No results for input(s): PROBNP in the last 8760 hours.  Radiological Exams: Ct Abdomen Pelvis W Contrast  Result Date: 09/04/2017 CLINICAL DATA:  Abdominal pain and fever.  Follow-up abscess. EXAM: CT ABDOMEN AND PELVIS WITH CONTRAST TECHNIQUE: Multidetector CT imaging of the abdomen and pelvis was performed using the standard protocol following bolus administration of intravenous contrast. CONTRAST:  160mL OMNIPAQUE IOHEXOL 300 MG/ML  SOLN COMPARISON:  08/27/2017 FINDINGS: Lower Chest: Stable small left pleural effusion. Decreased bibasilar atelectasis. Hepatobiliary: No hepatic masses identified. Gallbladder is unremarkable. Pancreas:  No mass or inflammatory changes. Spleen: Within normal limits in size and appearance. Adrenals/Urinary Tract: No masses identified. No evidence of hydronephrosis. Stomach/Bowel: Nasogastric tube is seen with tip in the distal gastric antrum. Previously seen pneumoperitoneum is no longer visualized on today's study. No evidence of dilated bowel loops. Rim enhancing fluid collections along the lesser curvature of the gastric antrum, posterior wall of the gastric body, and perisplenic space extending inferiorly into  the left lower quadrant show mild decrease in size compared to previous study. Index collection along the inferior aspect of gastric body measures 3.4 cm compared to 4.2 cm previously. Left lower quadrant component of fluid collection measures 4.2 x 2.0 cm compared to 6.8 x 3.0 cm previously. No new or enlarging fluid collections are seen within the abdomen or pelvis. Vascular/Lymphatic: No pathologically enlarged lymph nodes. No abdominal aortic aneurysm. Reproductive:  No mass or other significant abnormality identified. Other:  None. Musculoskeletal:  No suspicious bone lesions identified.  IMPRESSION: Mild decrease in several small fluid collections within the left abdomen and pelvis, as described above. No new or progressive disease within the abdomen or pelvis. Stable small left pleural effusion. Decreased bibasilar atelectasis. Electronically Signed   By: Earle Gell M.D.   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 Acute on chronic respiratory failure with hypoxia we will continue with T collar trials.  She is not a candidate for capping as already previously noted. 2. Lobar pneumonia treated we will continue present management. 3. Panhypopituitarism stable continue present management 4. Acute renal failure follow-up labs as needed 5. Anoxic encephalopathy improving 6. Pulmonary embolism treated   I have personally seen and evaluated the patient, evaluated laboratory and imaging results, formulated the assessment and plan and placed orders. The Patient requires high complexity decision making for assessment and support.  Case was discussed on Rounds with the Respiratory Therapy Staff  Allyne Gee, MD [facility] Pulmonary Critical Care Medicine Sleep Medicine

## 2017-09-05 DIAGNOSIS — N179 Acute kidney failure, unspecified: Secondary | ICD-10-CM | POA: Diagnosis not present

## 2017-09-05 DIAGNOSIS — J9621 Acute and chronic respiratory failure with hypoxia: Secondary | ICD-10-CM | POA: Diagnosis not present

## 2017-09-05 DIAGNOSIS — J181 Lobar pneumonia, unspecified organism: Secondary | ICD-10-CM | POA: Diagnosis not present

## 2017-09-05 DIAGNOSIS — I2699 Other pulmonary embolism without acute cor pulmonale: Secondary | ICD-10-CM | POA: Diagnosis not present

## 2017-09-05 DIAGNOSIS — G931 Anoxic brain damage, not elsewhere classified: Secondary | ICD-10-CM | POA: Diagnosis not present

## 2017-09-05 DIAGNOSIS — E230 Hypopituitarism: Secondary | ICD-10-CM | POA: Diagnosis not present

## 2017-09-05 LAB — PROTIME-INR
INR: 2.22
Prothrombin Time: 24.4 seconds — ABNORMAL HIGH (ref 11.4–15.2)

## 2017-09-05 NOTE — Progress Notes (Signed)
Pulmonary Debra Sanford   PULMONARY SERVICE  PROGRESS NOTE  Date of Service: 09/05/2017  Debra Sanford  HYW:[id number]  DOB: 1980/03/20   DOA: 07/24/2017  Referring Physician: Merton Border, MD  HPI: Debra Sanford is a 37 y.o. female seen for follow up of Acute on Chronic Respiratory Failure.  Patient is comfortable without distress at this time.  Remains on T collar  Medications: Reviewed on Rounds  Physical Exam:  Vitals:  Temperature 97.0 degrees pulse 87 respiratory 14 blood pressure 126/74 saturations 99%  Ventilator Settings  Off of the ventilator on T collar currently is on 28% FiO2 with PMV  . General: Comfortable at this time . Eyes: Grossly normal lids, irises & conjunctiva . ENT: grossly tongue is normal . Neck: no obvious mass . Cardiovascular: S1 S2 normal no gallop . Respiratory:  No rhonchi or rales at this time . Abdomen: soft . Skin: no rash seen on limited exam . Musculoskeletal: not rigid . Psychiatric:unable to assess . Neurologic: no seizure no involuntary movements         Lab Data:   Basic Metabolic Panel: Recent Labs  Lab 08/31/17 2347 09/01/17 0817 09/04/17 0723  NA  --  137 136  K  --  3.6 4.2  CL  --  103 98  CO2  --  27 27  GLUCOSE  --  85 85  BUN  --  16 17  CREATININE  --  0.36* 0.37*  CALCIUM  --  8.8* 9.2  MG 1.8  --  1.9  PHOS  --  3.3 3.3    Liver Function Tests: Recent Labs  Lab 09/01/17 0817 09/04/17 0723  AST 20 28  ALT 12 15  ALKPHOS 91 99  BILITOT 0.7 0.7  PROT 6.4* 6.3*  ALBUMIN 2.9* 3.0*   No results for input(s): LIPASE, AMYLASE in the last 168 hours. No results for input(s): AMMONIA in the last 168 hours.  CBC: No results for input(s): WBC, NEUTROABS, HGB, HCT, MCV, PLT in the last 168 hours.  Cardiac Enzymes: No results for input(s): CKTOTAL, CKMB, CKMBINDEX, TROPONINI in the last 168 hours.  BNP (last 3 results) No results for input(s): BNP in  the last 8760 hours.  ProBNP (last 3 results) No results for input(s): PROBNP in the last 8760 hours.  Radiological Exams: Ct Abdomen Pelvis W Contrast  Result Date: 09/04/2017 CLINICAL DATA:  Abdominal pain and fever.  Follow-up abscess. EXAM: CT ABDOMEN AND PELVIS WITH CONTRAST TECHNIQUE: Multidetector CT imaging of the abdomen and pelvis was performed using the standard protocol following bolus administration of intravenous contrast. CONTRAST:  171mL OMNIPAQUE IOHEXOL 300 MG/ML  SOLN COMPARISON:  08/27/2017 FINDINGS: Lower Chest: Stable small left pleural effusion. Decreased bibasilar atelectasis. Hepatobiliary: No hepatic masses identified. Gallbladder is unremarkable. Pancreas:  No mass or inflammatory changes. Spleen: Within normal limits in size and appearance. Adrenals/Urinary Tract: No masses identified. No evidence of hydronephrosis. Stomach/Bowel: Nasogastric tube is seen with tip in the distal gastric antrum. Previously seen pneumoperitoneum is no longer visualized on today's study. No evidence of dilated bowel loops. Rim enhancing fluid collections along the lesser curvature of the gastric antrum, posterior wall of the gastric body, and perisplenic space extending inferiorly into the left lower quadrant show mild decrease in size compared to previous study. Index collection along the inferior aspect of gastric body measures 3.4 cm compared to 4.2 cm previously. Left lower quadrant component of fluid collection measures 4.2 x  2.0 cm compared to 6.8 x 3.0 cm previously. No new or enlarging fluid collections are seen within the abdomen or pelvis. Vascular/Lymphatic: No pathologically enlarged lymph nodes. No abdominal aortic aneurysm. Reproductive:  No mass or other significant abnormality identified. Other:  None. Musculoskeletal:  No suspicious bone lesions identified. IMPRESSION: Mild decrease in several small fluid collections within the left abdomen and pelvis, as described above. No new or  progressive disease within the abdomen or pelvis. Stable small left pleural effusion. Decreased bibasilar atelectasis. Electronically Signed   By: Earle Gell M.D.   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  acute on chronic Respiratory failure with hypoxia will continue with T collar continue pulmonary toilet supportive care she is at baseline. 2. Lobar pneumonia treated with antibiotics we will continue to follow 3. Panhypopituitarism at baseline 4. Chronic next encephalopathy at baseline 5. Pulmonary embolism treated 6. Acute renal failure labs improved will follow along   I have personally seen and evaluated the patient, evaluated laboratory and imaging results, formulated the assessment and plan and placed orders. The Patient requires high complexity decision making for assessment and support.  Case was discussed on Rounds with the Respiratory Therapy Staff  Allyne Gee, MD [facility] Pulmonary Critical Care Medicine Sleep Medicine

## 2017-09-06 DIAGNOSIS — G931 Anoxic brain damage, not elsewhere classified: Secondary | ICD-10-CM | POA: Diagnosis not present

## 2017-09-06 DIAGNOSIS — J9621 Acute and chronic respiratory failure with hypoxia: Secondary | ICD-10-CM | POA: Diagnosis not present

## 2017-09-06 DIAGNOSIS — J181 Lobar pneumonia, unspecified organism: Secondary | ICD-10-CM | POA: Diagnosis not present

## 2017-09-06 DIAGNOSIS — N179 Acute kidney failure, unspecified: Secondary | ICD-10-CM | POA: Diagnosis not present

## 2017-09-06 LAB — PROTIME-INR
INR: 1.99
PROTHROMBIN TIME: 22.5 s — AB (ref 11.4–15.2)

## 2017-09-06 NOTE — Consult Note (Signed)
Chief Complaint: Patient was seen in consultation today for respiratory failure  Referring Physician(s): Dr. Laren Everts  Supervising Physician: Corrie Mckusick  Patient Status: [facility] - Out-pt  History of Present Illness: Debra Sanford is a 37 y.o. female with past medical history of cardiac arrest, respiratory failure, and anoxic brain injury who was transferred to [facility] for ongoing care.  She did have a gastrostomy tube placed at OSH which became dislodged in the abdominal wall.  This was removed 6/21.  Subsequent CT scan shows abdominal wall fluid collections thought to be sterile and related to leakage from gastrostomy prior to removal. Plan was to allow for resolve and replace gastrostomy, however recent imaging shows collections are stable.  Discussed case with Dr. Laren Everts and Dr. Earleen Newport.  Will plan to move forward with gastrostomy tube placement as early as Tuesday of next week.  Per Dr. Earleen Newport, no aspiration of fluid collections needed at this time.   Past Medical History:  Diagnosis Date  . Acute on chronic respiratory failure with hypoxia ([place]) 07/25/2017  . Acute renal failure (ARF) ([place]) 07/25/2017  . Cardiac arrest ([place])   . Chronic anoxic encephalopathy ([place]) 07/25/2017  . Diabetes insipidus ([place])   . HIT (heparin-induced thrombocytopenia) ([place])   . Lobar pneumonia ([place]) 07/25/2017  . 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 Pituitary adenoma ([place])   . Pulmonary embolism [facility])     Past Surgical History:  Procedure Laterality Date  . PEG PLACEMENT    . TRACHEOSTOMY    . TSS      Allergies: Patient has no allergy information on record.  Medications: Prior to Admission medications   Not on File     Family History  Family history unknown: Yes    Social History   Socioeconomic History  . Marital status: Single    Spouse name: Not on file  . Number of children: Not on file  . Years of  education: Not on file  . Highest education level: Not on file  Occupational History  . Not on file  Social Needs  . Financial resource strain: Not on file  . Food insecurity:    Worry: Not on file    Inability: Not on file  . Transportation needs:    Medical: Not on file    Non-medical: Not on file  Tobacco Use  . Smoking status: Never Smoker  . Smokeless tobacco: Never Used  Substance and Sexual Activity  . Alcohol use: Not Currently  . Drug use: Not Currently  . Sexual activity: Not Currently  Lifestyle  . Physical activity:    Days per week: Not on file    Minutes per session: Not on file  . Stress: Not on file  Relationships  . Social connections:    Talks on phone: Not on file    Gets together: Not on file    Attends religious service: Not on file    Active member of club or organization: Not on file    Attends meetings of clubs or organizations: Not on file    Relationship status: Not on file  Other Topics Concern  . Not on file  Social History Narrative  . Not on file     Review of Systems: A 12 point ROS discussed and pertinent positives are indicated in the HPI above.  All other systems are negative.  Review of Systems  Constitutional: Negative for fatigue and fever.  Respiratory: Negative for cough and shortness of  breath.   Cardiovascular: Negative for chest pain.  Gastrointestinal: Negative for abdominal pain.  Musculoskeletal: Negative for back pain.  Psychiatric/Behavioral: Positive for confusion.    Vital Signs: LMP  (LMP Unknown)   Physical Exam  Constitutional: She appears well-developed.  Cardiovascular: Normal rate, regular rhythm and normal heart sounds.  Pulmonary/Chest: Effort normal and breath sounds normal. No respiratory distress.  Abdominal: Soft. Bowel sounds are normal. She exhibits no distension. There is no tenderness.  Healed tract/scar from previous gastrostomy tube  Neurological: She is alert.  Skin: Skin is warm and dry.    Psychiatric: She has a normal mood and affect. Her behavior is normal. Judgment and thought content normal.  Nursing note and vitals reviewed.    MD Evaluation Airway: Other (comments) Airway comments: trach Heart: WNL Abdomen: Other (comments) Abdomen comments: healed scar Chest/ Lungs: WNL ASA  Classification: 3 Mallampati/Airway Score: Three   Imaging: Ct Abdomen Pelvis W Contrast  Result Date: 09/04/2017 CLINICAL DATA:  Abdominal pain and fever.  Follow-up abscess. EXAM: CT ABDOMEN AND PELVIS WITH CONTRAST TECHNIQUE: Multidetector CT imaging of the abdomen and pelvis was performed using the standard protocol following bolus administration of intravenous contrast. CONTRAST:  125mL OMNIPAQUE IOHEXOL 300 MG/ML  SOLN COMPARISON:  08/27/2017 FINDINGS: Lower Chest: Stable small left pleural effusion. Decreased bibasilar atelectasis. Hepatobiliary: No hepatic masses identified. Gallbladder is unremarkable. Pancreas:  No mass or inflammatory changes. Spleen: Within normal limits in size and appearance. Adrenals/Urinary Tract: No masses identified. No evidence of hydronephrosis. Stomach/Bowel: Nasogastric tube is seen with tip in the distal gastric antrum. Previously seen pneumoperitoneum is no longer visualized on today's study. No evidence of dilated bowel loops. Rim enhancing fluid collections along the lesser curvature of the gastric antrum, posterior wall of the gastric body, and perisplenic space extending inferiorly into the left lower quadrant show mild decrease in size compared to previous study. Index collection along the inferior aspect of gastric body measures 3.4 cm compared to 4.2 cm previously. Left lower quadrant component of fluid collection measures 4.2 x 2.0 cm compared to 6.8 x 3.0 cm previously. No new or enlarging fluid collections are seen within the abdomen or pelvis. Vascular/Lymphatic: No pathologically enlarged lymph nodes. No abdominal aortic aneurysm. Reproductive:  No  mass or other significant abnormality identified. Other:  None. Musculoskeletal:  No suspicious bone lesions identified. IMPRESSION: Mild decrease in several small fluid collections within the left abdomen and pelvis, as described above. No new or progressive disease within the abdomen or pelvis. Stable small left pleural effusion. Decreased bibasilar atelectasis. Electronically Signed   By: Earle Gell M.D.   On: 09/04/2017 15:22   Ct Abdomen Pelvis W Contrast  Result Date: 08/27/2017 CLINICAL DATA:  Abdominal abscess. EXAM: CT ABDOMEN AND PELVIS WITH CONTRAST TECHNIQUE: Multidetector CT imaging of the abdomen and pelvis was performed using the standard protocol following bolus administration of intravenous contrast. CONTRAST:  135mL OMNIPAQUE IOHEXOL 300 MG/ML  SOLN COMPARISON:  CT scan of August 15, 2017. FINDINGS: Lower chest: Mild left pleural effusion with adjacent subsegmental atelectasis. Mild right basilar subsegmental atelectasis is noted as well. Hepatobiliary: No focal liver abnormality is seen. No gallstones, gallbladder wall thickening, or biliary dilatation. Pancreas: Unremarkable. No pancreatic ductal dilatation or surrounding inflammatory changes. Spleen: Stable mild fluid collection is noted around the spleen. Adrenals/Urinary Tract: Adrenal glands are unremarkable. Kidneys are normal, without renal calculi, focal lesion, or hydronephrosis. Bladder is unremarkable. Stomach/Bowel: Nasogastric tube tip is seen in distal stomach. There is no evidence  of bowel obstruction or inflammation. 4.2 x 3.5 cm fluid collection is seen along the greater curvature of the body of the stomach which is not significantly changed compared to prior exam. Vascular/Lymphatic: No significant vascular findings are present. No enlarged abdominal or pelvic lymph nodes. Reproductive: Uterus and bilateral adnexa are unremarkable. Other: Amount of pneumoperitoneum identified is significantly smaller compared to prior exam. 6.8  x 3.0 cm fluid collection is seen in left side of the abdomen along anterior abdominal wall which is not significantly changed compared to prior exam. Musculoskeletal: No acute or significant osseous findings. IMPRESSION: Pneumoperitoneum noted on prior exam is significantly smaller currently. 6.8 x 3.0 cm fluid collection is seen along the anterior wall of the left side of the abdomen which is not significantly changed compared to prior exam. Grossly stable 4.2 x 3.5 cm fluid collection is noted along greater curvature of body of stomach. Mild left pleural effusion is noted with adjacent subsegmental atelectasis. Electronically Signed   By: Marijo Conception, M.D.   On: 08/27/2017 07:39   Ct Abdomen Pelvis W Contrast  Result Date: 08/15/2017 CLINICAL DATA:  Recent history of malpositioned gastrostomy tube (placed at outside institution). Patient's gastrostomy tube was subsequently removed, however request has been made for potential percutaneous insertion of the new gastrostomy tube for enteric nutrition supplementation purposes. EXAM: CT ABDOMEN AND PELVIS WITH CONTRAST TECHNIQUE: Multidetector CT imaging of the abdomen and pelvis was performed using the standard protocol following bolus administration of intravenous contrast. CONTRAST:  153mL OMNIPAQUE IOHEXOL 300 MG/ML  SOLN COMPARISON:  CT abdomen and pelvis-07/30/2017 FINDINGS: Examination is degraded due to patient body habitus. Lower chest: Limited visualization of the lower thorax demonstrates a trace left-sided effusion with associated bibasilar heterogeneous/consolidative opacities, left greater than right, similar to the 07/30/2017 examination. Cardiomegaly.  No pericardial effusion. Hepatobiliary: Normal hepatic contour. No discrete hepatic lesions. Normal appearance of the gallbladder given underdistention. There is a tiny amount of fluid about the caudal aspect the right lobe of the liver. Pancreas: Normal appearance of the pancreas Spleen:  Serpiginous fluid is seen adjacent to the spleen. Adrenals/Urinary Tract: There is symmetric enhancement of the bilateral kidneys. No definite renal stones on this postcontrast examination. No discrete renal lesions. No urine obstruction or perinephric stranding. Normal appearance of the bilateral adrenal glands. Normal appearance of the urinary bladder given degree distention. Stomach/Bowel: Enteric tube tip terminates within the gastric antrum. There is an approximately 4.3 x 4.0 x 7.5 cm mixed air and fluid containing collection adjacent to the mid body of the stomach (axial image 37, series 3, coronal image 62, series 6). Additionally, there is serpiginous fluid within the left upper abdominal quadrant adjacent to the spleen (representative axial image 22, series 3, coronal image 89, series 6) tracking along the anterior left lateral aspect of the peritoneal cavity with dominant component measuring approximately 12.4 x 7.0 x 3.1 cm (axial image 49, series 3; coronal image 47, series 6), previously, 14.7 x 7.5 x 2.4 cm. Interval reduction/resolution of free fluid within the lower pelvis. There is a moderate amount of pneumoperitoneum, likely minimally progressed compared to the 07/30/2017 examination. No evidence of enteric obstruction. No pneumatosis or portal venous gas. Vascular/Lymphatic: Normal caliber the abdominal aorta. The major branch vessels of the abdominal aorta appear patent on this non CTA examination. No bulky retroperitoneal, mesenteric, pelvic or inguinal lymphadenopathy. Reproductive: No discrete adnexal lesion. No significant free fluid in the pelvic cul-de-sac. Other: Re- demonstrated tract from prior gastrostomy tube (images 31  through 35, series 3). Diffuse body wall anasarca. Musculoskeletal: No acute or aggressive osseous abnormalities. Stigmata of DISH within the lower thoracic spine. IMPRESSION: 1. Interval development of an approximately 7.5 cm mixed air and fluid containing collection  adjacent to the mid body of the stomach regional to the expected location prior gastrostomy tube access site. This finding is also associated with interval progression of now moderate amount pneumoperitoneum. Additionally, fluid is again seen tracking from the left upper abdominal quadrant to the ventral aspect of the left mid hemiabdomen, however this appears grossly unchanged compared to the 07/30/2017 examination. Surgical consultation for definitive operative repair of the gastric rent and potential placement of a new gastrostomy tube may be performed as clinically indicated. 2. Grossly unchanged trace left-sided effusion associated left basilar opacities, atelectasis versus infiltrate. 3. Cardiomegaly. Electronically Signed   By: Sandi Mariscal M.D.   On: 08/15/2017 18:18   Dg Chest Port 1 View  Result Date: 08/20/2017 CLINICAL DATA:  Respiratory failure EXAM: PORTABLE CHEST 1 VIEW COMPARISON:  07/28/2017 FINDINGS: Tracheostomy tube is noted in satisfactory position. Right-sided PICC line is noted in the proximal superior vena cava. Nasogastric catheter is noted as well. Heart is again enlarged in size. Increased density in the left hemithorax is noted in part due to a posteriorly layering effusion as well as likely underlying atelectatic change. IMPRESSION: Increasing density in the left hemithorax as described. Tubes and lines as described. Electronically Signed   By: Inez Catalina M.D.   On: 08/20/2017 09:05   Dg Duanne Limerick W/water Sol Cm  Result Date: 08/23/2017 CLINICAL DATA:  Possible gastric leak EXAM: WATER SOLUBLE UPPER GI SERIES TECHNIQUE: Single-column upper GI series was performed using water soluble contrast. CONTRAST:  300 cc Omnipaque 300 COMPARISON:  CT scan 08/15/2017 FLUOROSCOPY TIME:  Fluoroscopy Time:  2 minutes, 36 seconds Radiation Exposure Index (if provided by the fluoroscopic device): 58.1 mGy Number of Acquired Spot Images: 1 FINDINGS: Initial KUB demonstrates a nasogastric tube with tip  in the stomach antrum. Morbid obesity noted. Due to the patient's obesity and lack of responsiveness, positioning maneuvers could not be performed and the patient cannot stand. Most of today's exam was performed with patient supine. We also turn the patient slightly LPO. Contrast medium was injected through the patient's nasogastric tube and fills the stomach. The contrast quickly empties into the duodenum and proximal small bowel. There is some flattening along the greater curvature of the stomach corresponding to the site of the prior collection of fluid and gas, for example on image 14/6. I do not observe any leak from the stomach or proximal small bowel. I used paddle compression to flatten the stomach and attempt to better assess for leak, but no well-defined the kick is demonstrated. There episodes of gastroesophageal reflux up to the level of the neck. IMPRESSION: 1. Currently no gastric or proximal small bowel leak is demonstrated, after injection of 300 cc of contrast medium and CT paddle compression. Contrast quickly extends into the proximal small bowel which is not dilated. Sensitivity may be mildly adversely affected by the lack of our ability to turn and re-position the patient to use gravity maneuvers and encourage filling of all parts of the stomach. 2. Gastroesophageal reflux. 3. There is a slight indentation along the left side of the stomach, probably corresponding to the adjacent collection of gas and fluid shown on CT scan of 08/15/2017. No visualized leak into this collection today. Electronically Signed   By: Cindra Eves.D.  On: 08/23/2017 14:14    Labs:  CBC: Recent Labs    08/05/17 0015 08/10/17 1102 08/13/17 0220 08/20/17 0747  WBC 13.8* 17.6* 13.1* 10.7*  HGB 9.5* 11.3* 10.3* 10.3*  HCT 33.3* 38.2 35.6* 35.9*  PLT 264 448* 355 PLATELET CLUMPS NOTED ON SMEAR, UNABLE TO ESTIMATE    COAGS: Recent Labs    07/25/17 1314 07/25/17 1852  07/26/17 2024  07/27/17 1015  09/03/17 0656 09/04/17 0723 09/05/17 1123 09/06/17 0358  INR 2.65 6.62*   < >  --  >10.00*   < > 2.12 2.05 2.22 1.99  APTT 56* 102*  --  84* >200*  --   --   --   --   --    < > = values in this interval not displayed.    BMP: Recent Labs    08/26/17 0634  08/27/17 0546 08/29/17 0517 09/01/17 0817 09/04/17 0723  NA 140  --   --  138 137 136  K 2.2*   < > 3.4* 3.3* 3.6 4.2  CL 99  --   --  98 103 98  CO2 36*  --   --  33* 27 27  GLUCOSE 102*  --   --  95 85 85  BUN 15  --   --  14 16 17   CALCIUM 8.4*  --   --  9.0 8.8* 9.2  CREATININE 0.42*  --   --  0.39* 0.36* 0.37*  GFRNONAA >60  --   --  >60 >60 >60  GFRAA >60  --   --  >60 >60 >60   < > = values in this interval not displayed.    LIVER FUNCTION TESTS: Recent Labs    08/26/17 0634 08/29/17 0517 09/01/17 0817 09/04/17 0723  BILITOT 0.6 0.6 0.7 0.7  AST 18 20 20 28   ALT 13 13 12 15   ALKPHOS 82 93 91 99  PROT 5.5* 5.7* 6.4* 6.3*  ALBUMIN 2.6* 2.7* 2.9* 3.0*    TUMOR MARKERS: No results for input(s): AFPTM, CEA, CA199, CHROMGRNA in the last 8760 hours.  Assessment and Plan: Dysphagia Patient with ongoing dysphagia and respiratory failure.  Of note, she has made significant strides in mental status over the past several days.  She is currently on coumadin which requires a 5 day hold.  Orders in place.  Plan for possible gastrostomy tube placement Tuesday at the earliest and will continue to assess mental status/ability to eat PO as she improves.  Check INR Tuesday.  Will work towards obtaining consent.   Thank you for this interesting consult.  I greatly enjoyed meeting [employer] and look forward to participating in their care.  A copy of this report was sent to the requesting provider on this date.  Electronically Signed: Docia Barrier, PA 09/06/2017, 4:08 PM   I spent a total of 40 Minutes    in face to face in clinical consultation, greater than 50% of which was  counseling/coordinating care for respiratory failure.

## 2017-09-06 NOTE — Progress Notes (Signed)
Pulmonary [place]   PULMONARY SERVICE  PROGRESS NOTE  Date of Service: 09/06/2017  Debra Sanford  UYQ:[id number]  DOB: November 09, 1980   DOA: 07/24/2017  Referring Physician: Merton Border, MD  HPI: Debra Sanford is a 37 y.o. female seen for follow up of Acute on Chronic Respiratory Failure.  Patient remains on T collar currently is on 20% oxygen good saturations are noted.  Medications: Reviewed on Rounds  Physical Exam:  Vitals: Temperature 98.3 pulse 84 respiratory rate 18 blood pressure 123/71 saturations 90%  Ventilator Settings on T collar 28% FiO2  . General: Comfortable at this time . Eyes: Grossly normal lids, irises & conjunctiva . ENT: grossly tongue is normal . Neck: no obvious mass . Cardiovascular: S1 S2 normal no gallop . Respiratory: No rhonchi or rales are noted . Abdomen: soft . Skin: no rash seen on limited exam . Musculoskeletal: not rigid . Psychiatric:unable to assess . Neurologic: no seizure no involuntary movements         Lab Data:   Basic Metabolic Panel: Recent Labs  Lab 08/31/17 2347 09/01/17 0817 09/04/17 0723  NA  --  137 136  K  --  3.6 4.2  CL  --  103 98  CO2  --  27 27  GLUCOSE  --  85 85  BUN  --  16 17  CREATININE  --  0.36* 0.37*  CALCIUM  --  8.8* 9.2  MG 1.8  --  1.9  PHOS  --  3.3 3.3    Liver Function Tests: Recent Labs  Lab 09/01/17 0817 09/04/17 0723  AST 20 28  ALT 12 15  ALKPHOS 91 99  BILITOT 0.7 0.7  PROT 6.4* 6.3*  ALBUMIN 2.9* 3.0*   No results for input(s): LIPASE, AMYLASE in the last 168 hours. No results for input(s): AMMONIA in the last 168 hours.  CBC: No results for input(s): WBC, NEUTROABS, HGB, HCT, MCV, PLT in the last 168 hours.  Cardiac Enzymes: No results for input(s): CKTOTAL, CKMB, CKMBINDEX, TROPONINI in the last 168 hours.  BNP (last 3 results) No results for input(s): BNP in the last 8760 hours.  ProBNP (last 3  results) No results for input(s): PROBNP in the last 8760 hours.  Radiological Exams: Ct Abdomen Pelvis W Contrast  Result Date: 09/04/2017 CLINICAL DATA:  Abdominal pain and fever.  Follow-up abscess. EXAM: CT ABDOMEN AND PELVIS WITH CONTRAST TECHNIQUE: Multidetector CT imaging of the abdomen and pelvis was performed using the standard protocol following bolus administration of intravenous contrast. CONTRAST:  180mL OMNIPAQUE IOHEXOL 300 MG/ML  SOLN COMPARISON:  08/27/2017 FINDINGS: Lower Chest: Stable small left pleural effusion. Decreased bibasilar atelectasis. Hepatobiliary: No hepatic masses identified. Gallbladder is unremarkable. Pancreas:  No mass or inflammatory changes. Spleen: Within normal limits in size and appearance. Adrenals/Urinary Tract: No masses identified. No evidence of hydronephrosis. Stomach/Bowel: Nasogastric tube is seen with tip in the distal gastric antrum. Previously seen pneumoperitoneum is no longer visualized on today's study. No evidence of dilated bowel loops. Rim enhancing fluid collections along the lesser curvature of the gastric antrum, posterior wall of the gastric body, and perisplenic space extending inferiorly into the left lower quadrant show mild decrease in size compared to previous study. Index collection along the inferior aspect of gastric body measures 3.4 cm compared to 4.2 cm previously. Left lower quadrant component of fluid collection measures 4.2 x 2.0 cm compared to 6.8 x 3.0 cm previously. No new or  enlarging fluid collections are seen within the abdomen or pelvis. Vascular/Lymphatic: No pathologically enlarged lymph nodes. No abdominal aortic aneurysm. Reproductive:  No mass or other significant abnormality identified. Other:  None. Musculoskeletal:  No suspicious bone lesions identified. IMPRESSION: Mild decrease in several small fluid collections within the left abdomen and pelvis, as described above. No new or progressive disease within the abdomen or  pelvis. Stable small left pleural effusion. Decreased bibasilar atelectasis. Electronically Signed   By: Earle Gell M.D.   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 Acute on chronic respiratory failure with hypoxia we will continue with T collar trials continue pulmonary toilet supportive care.  Patient secretions are fair to moderate she is at baseline 2. Panhypopituitarism continue present management 3. Lobar pneumonia treated we will continue to follow 4. Acute renal failure stable 5. Chronic encephalopathy improving 6. Pulmonary embolism treated   I have personally seen and evaluated the patient, evaluated laboratory and imaging results, formulated the assessment and plan and placed orders. The Patient requires high complexity decision making for assessment and support.  Case was discussed on Rounds with the Respiratory Therapy Staff  Allyne Gee, MD [facility] Pulmonary Critical Care Medicine Sleep Medicine

## 2017-09-07 LAB — COMPREHENSIVE METABOLIC PANEL
ALK PHOS: 117 U/L (ref 38–126)
ALT: 22 U/L (ref 0–44)
AST: 29 U/L (ref 15–41)
Albumin: 3 g/dL — ABNORMAL LOW (ref 3.5–5.0)
Anion gap: 9 (ref 5–15)
BILIRUBIN TOTAL: 0.5 mg/dL (ref 0.3–1.2)
BUN: 14 mg/dL (ref 6–20)
CALCIUM: 9.3 mg/dL (ref 8.9–10.3)
CO2: 28 mmol/L (ref 22–32)
CREATININE: 0.38 mg/dL — AB (ref 0.44–1.00)
Chloride: 97 mmol/L — ABNORMAL LOW (ref 98–111)
GFR calc Af Amer: >60 mL/min (ref >60)
Glucose, Bld: 122 mg/dL — ABNORMAL HIGH (ref 70–99)
POTASSIUM: 3 mmol/L — AB (ref 3.5–5.1)
Sodium: 134 mmol/L — ABNORMAL LOW (ref 135–145)
Total Protein: 6.6 g/dL (ref 6.5–8.1)

## 2017-09-07 LAB — TRIGLYCERIDES: Triglycerides: 98 mg/dL (ref <150)

## 2017-09-07 LAB — PROTIME-INR
INR: 1.79
Prothrombin Time: 20.7 seconds — ABNORMAL HIGH (ref 11.4–15.2)

## 2017-09-07 LAB — PHOSPHORUS: Phosphorus: 2.8 mg/dL (ref 2.5–4.6)

## 2017-09-07 LAB — MAGNESIUM: MAGNESIUM: 1.6 mg/dL — AB (ref 1.7–2.4)

## 2017-09-08 ENCOUNTER — Other Ambulatory Visit (HOSPITAL_COMMUNITY): Payer: Medicaid Other

## 2017-09-08 DIAGNOSIS — E230 Hypopituitarism: Secondary | ICD-10-CM | POA: Diagnosis not present

## 2017-09-08 DIAGNOSIS — G931 Anoxic brain damage, not elsewhere classified: Secondary | ICD-10-CM | POA: Diagnosis not present

## 2017-09-08 DIAGNOSIS — N179 Acute kidney failure, unspecified: Secondary | ICD-10-CM | POA: Diagnosis not present

## 2017-09-08 DIAGNOSIS — J181 Lobar pneumonia, unspecified organism: Secondary | ICD-10-CM | POA: Diagnosis not present

## 2017-09-08 DIAGNOSIS — J9621 Acute and chronic respiratory failure with hypoxia: Secondary | ICD-10-CM | POA: Diagnosis not present

## 2017-09-08 DIAGNOSIS — I2699 Other pulmonary embolism without acute cor pulmonale: Secondary | ICD-10-CM | POA: Diagnosis not present

## 2017-09-08 LAB — POTASSIUM: POTASSIUM: 4.3 mmol/L (ref 3.5–5.1)

## 2017-09-08 LAB — PROTIME-INR
INR: 1.47
Prothrombin Time: 17.7 seconds — ABNORMAL HIGH (ref 11.4–15.2)

## 2017-09-08 LAB — MAGNESIUM: MAGNESIUM: 2.1 mg/dL (ref 1.7–2.4)

## 2017-09-08 NOTE — Progress Notes (Signed)
Pulmonary [place]   PULMONARY SERVICE  PROGRESS NOTE  Date of Service: 09/08/2017  Lun Muro  GQQ:[id number]  DOB: 02/26/1981   DOA: 07/24/2017  Referring Physician: Merton Border, MD  HPI: Debra Sanford is a 37 y.o. female seen for follow up of Acute on Chronic Respiratory Failure.  She is on T collar reportedly a little bit more awake.  Patient's been tolerating 28% FiO2 no significant desaturations are noted  Medications: Reviewed on Rounds  Physical Exam:  Vitals: Temperature 98.2 pulse 105 respiratory rate 14 blood pressure 122/76 saturations 98%  Ventilator Settings off the ventilator on T collar trials  . General: Comfortable at this time . Eyes: Grossly normal lids, irises & conjunctiva . ENT: grossly tongue is normal . Neck: no obvious mass . Cardiovascular: S1 S2 normal no gallop . Respiratory: No rhonchi or rales . Abdomen: soft . Skin: no rash seen on limited exam . Musculoskeletal: not rigid . Psychiatric:unable to assess . Neurologic: no seizure no involuntary movements         Lab Data:   Basic Metabolic Panel: Recent Labs  Lab 09/04/17 0723 09/07/17 0025 09/07/17 0551 09/08/17 0630  NA 136  --  134*  --   K 4.2  --  3.0* 4.3  CL 98  --  97*  --   CO2 27  --  28  --   GLUCOSE 85  --  122*  --   BUN 17  --  14  --   CREATININE 0.37*  --  0.38*  --   CALCIUM 9.2  --  9.3  --   MG 1.9 1.6*  --  2.1  PHOS 3.3  --  2.8  --     Liver Function Tests: Recent Labs  Lab 09/04/17 0723 09/07/17 0551  AST 28 29  ALT 15 22  ALKPHOS 99 117  BILITOT 0.7 0.5  PROT 6.3* 6.6  ALBUMIN 3.0* 3.0*   No results for input(s): LIPASE, AMYLASE in the last 168 hours. No results for input(s): AMMONIA in the last 168 hours.  CBC: No results for input(s): WBC, NEUTROABS, HGB, HCT, MCV, PLT in the last 168 hours.  Cardiac Enzymes: No results for input(s): CKTOTAL, CKMB, CKMBINDEX, TROPONINI in  the last 168 hours.  BNP (last 3 results) No results for input(s): BNP in the last 8760 hours.  ProBNP (last 3 results) No results for input(s): PROBNP in the last 8760 hours.  Radiological Exams: Dg Abd 1 View  Result Date: 09/08/2017 CLINICAL DATA:  Evaluate enteric tube placement EXAM: ABDOMEN - 1 VIEW COMPARISON:  09/04/2017 FINDINGS: The enteric tube is in the left upper quadrant of the abdomen and is looped within the expected location of the body of stomach. IMPRESSION: 1. Enteric tube is in the left upper quadrant of the abdomen in the expected location the body of stomach. Electronically Signed   By: Kerby Moors M.D.   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 Acute on chronic respiratory failure with hypoxia we will continue with present management patient is tolerating T collar well.  Will likely be her baseline. 2. Lobar pneumonia treated clinically improved 3. Panhypopituitarism stable we will continue  to monitor 4. Chronic anoxic encephalopathy grossly unchanged 5. Pulmonary embolism treated stable   I have personally seen and evaluated the patient, evaluated laboratory and imaging results, formulated the assessment and plan and placed orders. The Patient requires high complexity decision making for assessment and support.  Case was discussed on Rounds with the Respiratory Therapy Staff  Allyne Gee, MD [facility] Pulmonary Critical Care Medicine Sleep Medicine

## 2017-09-09 DIAGNOSIS — J9621 Acute and chronic respiratory failure with hypoxia: Secondary | ICD-10-CM | POA: Diagnosis not present

## 2017-09-09 DIAGNOSIS — J181 Lobar pneumonia, unspecified organism: Secondary | ICD-10-CM | POA: Diagnosis not present

## 2017-09-09 DIAGNOSIS — G931 Anoxic brain damage, not elsewhere classified: Secondary | ICD-10-CM | POA: Diagnosis not present

## 2017-09-09 DIAGNOSIS — N179 Acute kidney failure, unspecified: Secondary | ICD-10-CM | POA: Diagnosis not present

## 2017-09-09 LAB — PROTIME-INR
INR: 1.44
Prothrombin Time: 17.4 seconds — ABNORMAL HIGH (ref 11.4–15.2)

## 2017-09-09 NOTE — Progress Notes (Addendum)
  Patient ID: Debra Sanford, female   DOB: 1980-10-05, 37 y.o.   MRN: [id number]  Percutaneous gastric tube placement in IR 09/10/17  Risks and benefits discussed with the patient's Aunt Wannetta Sender including, but not limited to the need for a barium enema during the procedure, bleeding, infection, peritonitis, or damage to adjacent structures.  All of the patient's Aunts questions were answered, she is agreeable to proceed. Consent signed and in chart.

## 2017-09-09 NOTE — Progress Notes (Signed)
Pulmonary [place]   PULMONARY SERVICE  PROGRESS NOTE  Date of Service: 09/09/2017  Debra Sanford  [id number]:[id number]  DOB: 03-14-1980   DOA: 07/24/2017  Referring Physician: Merton Border, MD  HPI: Debra Sanford is a 37 y.o. female seen for follow up of Acute on Chronic Respiratory Failure.  Patient is on T collar comfortable without distress.  She is at baseline  Medications: Reviewed on Rounds  Physical Exam:  Vitals: Temperature 97.4 pulse 87 respiratory rate 12 blood pressure 132/83 saturations 99%  Ventilator Settings currently is on T collar off the ventilator  . General: Comfortable at this time . Eyes: Grossly normal lids, irises & conjunctiva . ENT: grossly tongue is normal . Neck: no obvious mass . Cardiovascular: S1 S2 normal no gallop . Respiratory: No rhonchi or rales . Abdomen: soft . Skin: no rash seen on limited exam . Musculoskeletal: not rigid . Psychiatric:unable to assess . Neurologic: no seizure no involuntary movements         Lab Data:   Basic Metabolic Panel: Recent Labs  Lab 09/04/17 0723 09/07/17 0025 09/07/17 0551 09/08/17 0630  NA 136  --  134*  --   K 4.2  --  3.0* 4.3  CL 98  --  97*  --   CO2 27  --  28  --   GLUCOSE 85  --  122*  --   BUN 17  --  14  --   CREATININE 0.37*  --  0.38*  --   CALCIUM 9.2  --  9.3  --   MG 1.9 1.6*  --  2.1  PHOS 3.3  --  2.8  --     Liver Function Tests: Recent Labs  Lab 09/04/17 0723 09/07/17 0551  AST 28 29  ALT 15 22  ALKPHOS 99 117  BILITOT 0.7 0.5  PROT 6.3* 6.6  ALBUMIN 3.0* 3.0*   No results for input(s): LIPASE, AMYLASE in the last 168 hours. No results for input(s): AMMONIA in the last 168 hours.  CBC: No results for input(s): WBC, NEUTROABS, HGB, HCT, MCV, PLT in the last 168 hours.  Cardiac Enzymes: No results for input(s): CKTOTAL, CKMB, CKMBINDEX, TROPONINI in the last 168 hours.  BNP (last 3 results) No  results for input(s): BNP in the last 8760 hours.  ProBNP (last 3 results) No results for input(s): PROBNP in the last 8760 hours.  Radiological Exams: Dg Abd 1 View  Result Date: 09/08/2017 CLINICAL DATA:  Evaluate enteric tube placement EXAM: ABDOMEN - 1 VIEW COMPARISON:  09/04/2017 FINDINGS: The enteric tube is in the left upper quadrant of the abdomen and is looped within the expected location of the body of stomach. IMPRESSION: 1. Enteric tube is in the left upper quadrant of the abdomen in the expected location the body of stomach. Electronically Signed   By: Kerby Moors M.D.   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 Acute on chronic respiratory failure with hypoxia patient will continue weaning on T collar continue pulmonary toilet supportive care prognosis. 2. Lobar pneumonia treated with clinically resolved 3. Panhypopituitarism stable we will continue present management 4. Acute renal failure at baseline 5. Chronic anoxic encephalopathy  stable 6. Pulmonary embolism at baseline   I have personally seen and evaluated the patient, evaluated laboratory and imaging results, formulated the assessment and plan and placed orders. The Patient requires high complexity decision making for assessment and support.  Case was discussed on Rounds with the Respiratory Therapy Staff  Allyne Gee, MD [facility] Pulmonary Critical Care Medicine Sleep Medicine

## 2017-09-10 DIAGNOSIS — J181 Lobar pneumonia, unspecified organism: Secondary | ICD-10-CM | POA: Diagnosis not present

## 2017-09-10 DIAGNOSIS — G931 Anoxic brain damage, not elsewhere classified: Secondary | ICD-10-CM | POA: Diagnosis not present

## 2017-09-10 DIAGNOSIS — J9621 Acute and chronic respiratory failure with hypoxia: Secondary | ICD-10-CM | POA: Diagnosis not present

## 2017-09-10 DIAGNOSIS — N179 Acute kidney failure, unspecified: Secondary | ICD-10-CM | POA: Diagnosis not present

## 2017-09-10 LAB — COMPREHENSIVE METABOLIC PANEL
ALBUMIN: 3.5 g/dL (ref 3.5–5.0)
ALT: 27 U/L (ref 0–44)
AST: 31 U/L (ref 15–41)
Alkaline Phosphatase: 141 U/L — ABNORMAL HIGH (ref 38–126)
Anion gap: 11 (ref 5–15)
BUN: 13 mg/dL (ref 6–20)
CHLORIDE: 100 mmol/L (ref 98–111)
CO2: 28 mmol/L (ref 22–32)
Calcium: 10 mg/dL (ref 8.9–10.3)
Creatinine, Ser: 0.45 mg/dL (ref 0.44–1.00)
GFR calc Af Amer: >60 mL/min (ref >60)
GFR calc non Af Amer: >60 mL/min (ref >60)
GLUCOSE: 75 mg/dL (ref 70–99)
POTASSIUM: 4 mmol/L (ref 3.5–5.1)
Sodium: 139 mmol/L (ref 135–145)
Total Bilirubin: 0.7 mg/dL (ref 0.3–1.2)
Total Protein: 7.8 g/dL (ref 6.5–8.1)

## 2017-09-10 LAB — TRIGLYCERIDES: TRIGLYCERIDES: 93 mg/dL (ref <150)

## 2017-09-10 LAB — PROTIME-INR
INR: 1.36
Prothrombin Time: 16.6 seconds — ABNORMAL HIGH (ref 11.4–15.2)

## 2017-09-10 LAB — CBC
HCT: 41.1 % (ref 36.0–46.0)
HEMOGLOBIN: 12.8 g/dL (ref 12.0–15.0)
MCH: 25.1 pg — ABNORMAL LOW (ref 26.0–34.0)
MCHC: 31.1 g/dL (ref 30.0–36.0)
MCV: 80.7 fL (ref 78.0–100.0)
Platelets: 401 10*3/uL — ABNORMAL HIGH (ref 150–400)
RBC: 5.09 MIL/uL (ref 3.87–5.11)
RDW: 19.9 % — ABNORMAL HIGH (ref 11.5–15.5)
WBC: 18.7 10*3/uL — ABNORMAL HIGH (ref 4.0–10.5)

## 2017-09-10 LAB — MAGNESIUM: Magnesium: 1.9 mg/dL (ref 1.7–2.4)

## 2017-09-10 LAB — PHOSPHORUS: PHOSPHORUS: 3.1 mg/dL (ref 2.5–4.6)

## 2017-09-10 NOTE — Progress Notes (Signed)
Pulmonary [place]   PULMONARY SERVICE  PROGRESS NOTE  Date of Service: 09/10/2017  Debra Sanford  TSV:[id number]  DOB: September 16, 1980   DOA: 07/24/2017  Referring Physician: Merton Border, MD  HPI: Debra Sanford is a 37 y.o. female seen for follow up of Acute on Chronic Respiratory Failure.  She is doing better apparently overnight she self decannulated and had to be recannulated.  Patient right now is on T collar has been on 20% FiO2 she has a #6 XLT in place.  Currently comfortable without distress.  Medications: Reviewed on Rounds  Physical Exam:  Vitals: Temperature 98.1 pulse 103 respiratory rate 16 blood pressure 132/85 saturations 100%  Ventilator Settings off the ventilator on T collar  . General: Comfortable at this time . Eyes: Grossly normal lids, irises & conjunctiva . ENT: grossly tongue is normal . Neck: no obvious mass . Cardiovascular: S1 S2 normal no gallop . Respiratory: Secretions are fair to moderate . Abdomen: soft . Skin: no rash seen on limited exam . Musculoskeletal: not rigid . Psychiatric:unable to assess . Neurologic: no seizure no involuntary movements         Lab Data:   Basic Metabolic Panel: Recent Labs  Lab 09/04/17 0723 09/07/17 0025 09/07/17 0551 09/08/17 0630 09/10/17 0511  NA 136  --  134*  --  139  K 4.2  --  3.0* 4.3 4.0  CL 98  --  97*  --  100  CO2 27  --  28  --  28  GLUCOSE 85  --  122*  --  75  BUN 17  --  14  --  13  CREATININE 0.37*  --  0.38*  --  0.45  CALCIUM 9.2  --  9.3  --  10.0  MG 1.9 1.6*  --  2.1 1.9  PHOS 3.3  --  2.8  --  3.1    Liver Function Tests: Recent Labs  Lab 09/04/17 0723 09/07/17 0551 09/10/17 0511  AST 28 29 31   ALT 15 22 27   ALKPHOS 99 117 141*  BILITOT 0.7 0.5 0.7  PROT 6.3* 6.6 7.8  ALBUMIN 3.0* 3.0* 3.5   No results for input(s): LIPASE, AMYLASE in the last 168 hours. No results for input(s): AMMONIA in the last 168  hours.  CBC: Recent Labs  Lab 09/10/17 0511  WBC 18.7*  HGB 12.8  HCT 41.1  MCV 80.7  PLT 401*    Cardiac Enzymes: No results for input(s): CKTOTAL, CKMB, CKMBINDEX, TROPONINI in the last 168 hours.  BNP (last 3 results) No results for input(s): BNP in the last 8760 hours.  ProBNP (last 3 results) No results for input(s): PROBNP in the last 8760 hours.  Radiological Exams: Dg Abd 1 View  Result Date: 09/08/2017 CLINICAL DATA:  Evaluate enteric tube placement EXAM: ABDOMEN - 1 VIEW COMPARISON:  09/04/2017 FINDINGS: The enteric tube is in the left upper quadrant of the abdomen and is looped within the expected location of the body of stomach. IMPRESSION: 1. Enteric tube is in the left upper quadrant of the abdomen in the expected location the body of stomach. Electronically Signed   By: Kerby Moors M.D.   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 Acute on chronic respiratory failure with hypoxia we will continue with T collar at baseline.  She did not do so bad decannulated however because of her size and sleep apnea issues keep the trach in.  Will discuss with our team in the treatment team regarding perhaps downsizing the trach to a #5 XLT. 2. Lobar pneumonia treated with antibiotics we will continue with supportive care 3. Panhypopituitarism stable at baseline 4. Acute renal failure we will continue with present management 5. Chronic anoxic encephalopathy she is improving continue supportive care 6. Pulmonary embolism treated   I have personally seen and evaluated the patient, evaluated laboratory and imaging results, formulated the assessment and plan and placed orders. The Patient requires  high complexity decision making for assessment and support.  Case was discussed on Rounds with the Respiratory Therapy Staff  Allyne Gee, MD [facility] Pulmonary Critical Care Medicine Sleep Medicine

## 2017-09-11 DIAGNOSIS — N179 Acute kidney failure, unspecified: Secondary | ICD-10-CM | POA: Diagnosis not present

## 2017-09-11 DIAGNOSIS — G931 Anoxic brain damage, not elsewhere classified: Secondary | ICD-10-CM | POA: Diagnosis not present

## 2017-09-11 DIAGNOSIS — J9621 Acute and chronic respiratory failure with hypoxia: Secondary | ICD-10-CM | POA: Diagnosis not present

## 2017-09-11 DIAGNOSIS — E230 Hypopituitarism: Secondary | ICD-10-CM | POA: Diagnosis not present

## 2017-09-11 DIAGNOSIS — J181 Lobar pneumonia, unspecified organism: Secondary | ICD-10-CM | POA: Diagnosis not present

## 2017-09-11 DIAGNOSIS — I2699 Other pulmonary embolism without acute cor pulmonale: Secondary | ICD-10-CM | POA: Diagnosis not present

## 2017-09-11 LAB — CBC
HEMATOCRIT: 39.6 % (ref 36.0–46.0)
Hemoglobin: 12.2 g/dL (ref 12.0–15.0)
MCH: 24.9 pg — ABNORMAL LOW (ref 26.0–34.0)
MCHC: 30.8 g/dL (ref 30.0–36.0)
MCV: 81 fL (ref 78.0–100.0)
Platelets: 356 10*3/uL (ref 150–400)
RBC: 4.89 MIL/uL (ref 3.87–5.11)
RDW: 19.9 % — AB (ref 11.5–15.5)
WBC: 18.8 10*3/uL — AB (ref 4.0–10.5)

## 2017-09-11 LAB — PROTIME-INR
INR: 1.47
Prothrombin Time: 17.7 seconds — ABNORMAL HIGH (ref 11.4–15.2)

## 2017-09-11 NOTE — Progress Notes (Signed)
Pulmonary [place]   PULMONARY SERVICE  PROGRESS NOTE  Date of Service: 09/11/2017  Shaunika Italiano  GYJ:[id number]  DOB: April 11, 1980   DOA: 07/24/2017  Referring Physician: Merton Border, MD  HPI: Debra Sanford is a 37 y.o. female seen for follow up of Acute on Chronic Respiratory Failure.  She is doing fine at her baseline on T collar has been on room air also is tolerating the PMV she is supposed to have a PEG placed  Medications: Reviewed on Rounds  Physical Exam:  Vitals: Temperature 97.0 pulse 98 respiratory rate 14 blood pressure 138/88 saturations 99%  Ventilator Settings currently on T collar FiO2 21% with PMV  . General: Comfortable at this time . Eyes: Grossly normal lids, irises & conjunctiva . ENT: grossly tongue is normal . Neck: no obvious mass . Cardiovascular: S1 S2 normal no gallop . Respiratory: No rhonchi or rales are noted . Abdomen: soft . Skin: no rash seen on limited exam . Musculoskeletal: not rigid . Psychiatric:unable to assess . Neurologic: no seizure no involuntary movements         Lab Data:   Basic Metabolic Panel: Recent Labs  Lab 09/07/17 0025 09/07/17 0551 09/08/17 0630 09/10/17 0511  NA  --  134*  --  139  K  --  3.0* 4.3 4.0  CL  --  97*  --  100  CO2  --  28  --  28  GLUCOSE  --  122*  --  75  BUN  --  14  --  13  CREATININE  --  0.38*  --  0.45  CALCIUM  --  9.3  --  10.0  MG 1.6*  --  2.1 1.9  PHOS  --  2.8  --  3.1    Liver Function Tests: Recent Labs  Lab 09/07/17 0551 09/10/17 0511  AST 29 31  ALT 22 27  ALKPHOS 117 141*  BILITOT 0.5 0.7  PROT 6.6 7.8  ALBUMIN 3.0* 3.5   No results for input(s): LIPASE, AMYLASE in the last 168 hours. No results for input(s): AMMONIA in the last 168 hours.  CBC: Recent Labs  Lab 09/10/17 0511 09/11/17 0624  WBC 18.7* 18.8*  HGB 12.8 12.2  HCT 41.1 39.6  MCV 80.7 81.0  PLT 401* 356    Cardiac Enzymes: No  results for input(s): CKTOTAL, CKMB, CKMBINDEX, TROPONINI in the last 168 hours.  BNP (last 3 results) No results for input(s): BNP in the last 8760 hours.  ProBNP (last 3 results) No results for input(s): PROBNP in the last 8760 hours.  Radiological Exams: No results found.  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 Acute on chronic respiratory failure with hypoxia we will continue with weaning on T collar continue pulmonary toilet supportive care. 2. Lobar pneumonia treated with antibiotics improved 3. Panhypopituitarism stable at baseline 4. Acute renal failure resolved 5. Anoxic encephalopathy improved 6. Pulmonary embolism treated   I have personally seen and evaluated the patient, evaluated laboratory and imaging results, formulated the assessment and plan and placed orders. The Patient requires high complexity decision making for assessment and support.  Case was discussed on Rounds with the Respiratory Therapy Staff  Allyne Gee, MD  [facility] Pulmonary Critical Care Medicine Sleep Medicine

## 2017-09-12 ENCOUNTER — Other Ambulatory Visit (HOSPITAL_COMMUNITY): Payer: Medicaid Other

## 2017-09-12 ENCOUNTER — Encounter (HOSPITAL_COMMUNITY): Payer: Self-pay | Admitting: Interventional Radiology

## 2017-09-12 DIAGNOSIS — J9621 Acute and chronic respiratory failure with hypoxia: Secondary | ICD-10-CM | POA: Diagnosis not present

## 2017-09-12 DIAGNOSIS — I2699 Other pulmonary embolism without acute cor pulmonale: Secondary | ICD-10-CM | POA: Diagnosis not present

## 2017-09-12 DIAGNOSIS — J181 Lobar pneumonia, unspecified organism: Secondary | ICD-10-CM | POA: Diagnosis not present

## 2017-09-12 DIAGNOSIS — G931 Anoxic brain damage, not elsewhere classified: Secondary | ICD-10-CM | POA: Diagnosis not present

## 2017-09-12 DIAGNOSIS — E230 Hypopituitarism: Secondary | ICD-10-CM | POA: Diagnosis not present

## 2017-09-12 DIAGNOSIS — N179 Acute kidney failure, unspecified: Secondary | ICD-10-CM | POA: Diagnosis not present

## 2017-09-12 HISTORY — PX: IR GASTROSTOMY TUBE MOD SED: IMG625

## 2017-09-12 LAB — PROTIME-INR
INR: 1.39
PROTHROMBIN TIME: 16.9 s — AB (ref 11.4–15.2)

## 2017-09-12 LAB — CBC
HCT: 41.5 % (ref 36.0–46.0)
Hemoglobin: 12.4 g/dL (ref 12.0–15.0)
MCH: 24.7 pg — ABNORMAL LOW (ref 26.0–34.0)
MCHC: 29.9 g/dL — ABNORMAL LOW (ref 30.0–36.0)
MCV: 82.5 fL (ref 78.0–100.0)
Platelets: 370 10*3/uL (ref 150–400)
RBC: 5.03 MIL/uL (ref 3.87–5.11)
RDW: 19.9 % — AB (ref 11.5–15.5)
WBC: 17.8 10*3/uL — AB (ref 4.0–10.5)

## 2017-09-12 MED ORDER — FENTANYL CITRATE (PF) 100 MCG/2ML IJ SOLN
INTRAMUSCULAR | Status: AC
  Filled 2017-09-12: qty 2

## 2017-09-12 MED ORDER — LIDOCAINE HCL 1 % IJ SOLN
INTRAMUSCULAR | Status: AC
  Filled 2017-09-12: qty 20

## 2017-09-12 MED ORDER — GLUCAGON HCL RDNA (DIAGNOSTIC) 1 MG IJ SOLR
INTRAMUSCULAR | Status: AC
  Filled 2017-09-12: qty 1

## 2017-09-12 MED ORDER — FENTANYL CITRATE (PF) 100 MCG/2ML IJ SOLN
INTRAMUSCULAR | Status: AC | PRN
  Administered 2017-09-12: 50 ug via INTRAVENOUS
  Administered 2017-09-12: 25 ug via INTRAVENOUS

## 2017-09-12 MED ORDER — IOPAMIDOL (ISOVUE-300) INJECTION 61%
INTRAVENOUS | Status: AC
  Administered 2017-09-12: 15 mL
  Filled 2017-09-12: qty 50

## 2017-09-12 MED ORDER — LIDOCAINE HCL 1 % IJ SOLN
INTRAMUSCULAR | Status: AC | PRN
  Administered 2017-09-12: 20 mL

## 2017-09-12 MED ORDER — CEFAZOLIN SODIUM-DEXTROSE 2-4 GM/100ML-% IV SOLN
INTRAVENOUS | Status: AC
  Administered 2017-09-12: 2000 mg
  Filled 2017-09-12: qty 100

## 2017-09-12 MED ORDER — MIDAZOLAM HCL 2 MG/2ML IJ SOLN
INTRAMUSCULAR | Status: AC | PRN
  Administered 2017-09-12 (×2): 1 mg via INTRAVENOUS

## 2017-09-12 MED ORDER — MIDAZOLAM HCL 2 MG/2ML IJ SOLN
INTRAMUSCULAR | Status: AC
  Filled 2017-09-12: qty 2

## 2017-09-12 NOTE — Sedation Documentation (Signed)
Patient on trach collar at 28%. ETCO2 unable to be monitored for precedure

## 2017-09-12 NOTE — Procedures (Signed)
Interventional Radiology Procedure Note  Procedure: Placement of percutaneous 20F pull-through gastrostomy tube. Complications: None Recommendations: - NPO except for sips and chips remainder of today and overnight - Maintain G-tube to LWS until tomorrow morning  - May advance diet as tolerated and begin using tube tomorrow morning  Signed,   Daivion Pape S. Daryana Whirley, DO   

## 2017-09-12 NOTE — Progress Notes (Signed)
Pulmonary [place]   PULMONARY SERVICE  PROGRESS NOTE  Date of Service: 09/12/2017  Shloka Baldridge  SWF:[id number]  DOB: 04-May-1980   DOA: 07/24/2017  Referring Physician: Merton Border, MD  HPI: Debra Sanford is a 37 y.o. female seen for follow up of Acute on Chronic Respiratory Failure.  Patient is on T collar trials she is at baseline little bit more awake  Medications: Reviewed on Rounds  Physical Exam:  Vitals:  Temperature 98.7 degrees pulse 97 respiratory rate 17 blood pressure 143/79 saturations 97%  Ventilator Settings  Off the ventilator on T collar  . General: Comfortable at this time . Eyes: Grossly normal lids, irises & conjunctiva . ENT: grossly tongue is normal . Neck: no obvious mass . Cardiovascular: S1 S2 normal no gallop . Respiratory:  No rhonchi or rales are noted . Abdomen: soft . Skin: no rash seen on limited exam . Musculoskeletal: not rigid . Psychiatric:unable to assess . Neurologic: no seizure no involuntary movements         Lab Data:   Basic Metabolic Panel: Recent Labs  Lab 09/07/17 0025 09/07/17 0551 09/08/17 0630 09/10/17 0511  NA  --  134*  --  139  K  --  3.0* 4.3 4.0  CL  --  97*  --  100  CO2  --  28  --  28  GLUCOSE  --  122*  --  75  BUN  --  14  --  13  CREATININE  --  0.38*  --  0.45  CALCIUM  --  9.3  --  10.0  MG 1.6*  --  2.1 1.9  PHOS  --  2.8  --  3.1    Liver Function Tests: Recent Labs  Lab 09/07/17 0551 09/10/17 0511  AST 29 31  ALT 22 27  ALKPHOS 117 141*  BILITOT 0.5 0.7  PROT 6.6 7.8  ALBUMIN 3.0* 3.5   No results for input(s): LIPASE, AMYLASE in the last 168 hours. No results for input(s): AMMONIA in the last 168 hours.  CBC: Recent Labs  Lab 09/10/17 0511 09/11/17 0624 09/12/17 0611  WBC 18.7* 18.8* 17.8*  HGB 12.8 12.2 12.4  HCT 41.1 39.6 41.5  MCV 80.7 81.0 82.5  PLT 401* 356 370    Cardiac Enzymes: No results for  input(s): CKTOTAL, CKMB, CKMBINDEX, TROPONINI in the last 168 hours.  BNP (last 3 results) No results for input(s): BNP in the last 8760 hours.  ProBNP (last 3 results) No results for input(s): PROBNP in the last 8760 hours.  Radiological Exams: Ir Gastrostomy Tube Mod Sed  Result Date: 09/12/2017 INDICATION: 37 year old female with a history dysphagia EXAM: PERC PLACEMENT GASTROSTOMY MEDICATIONS: 2.0 g Ancef; Antibiotics were administered within 1 hour of the procedure. ANESTHESIA/SEDATION: Versed 2.0 mg IV; Fentanyl 75 mcg IV Moderate Sedation Time:  5 10 minutes The patient was continuously monitored during the procedure by the interventional radiology nurse under my direct supervision. CONTRAST:  10 cc-administered into the gastric lumen. FLUOROSCOPY TIME:  Fluoroscopy Time: 2 minutes 0 seconds (30.2 mGy). COMPLICATIONS: None immediate. PROCEDURE: Informed written consent was obtained from the patient's family after a thorough discussion of the procedural risks, benefits and alternatives. All questions were addressed. Maximal Sterile Barrier Technique was utilized including caps, mask, sterile gowns, sterile gloves, sterile drape, hand hygiene and skin antiseptic. A timeout was performed prior to the initiation of the procedure. The procedure, risks, benefits, and alternatives were  explained to the patient. Questions regarding the procedure were encouraged and answered. The patient understands and consents to the procedure. The epigastrium was prepped with Betadine in a sterile fashion, and a sterile drape was applied covering the operative field. A sterile gown and sterile gloves were used for the procedure. A 5-French orogastric tube is placed under fluoroscopic guidance. Scout imaging of the abdomen confirms barium within the transverse colon. The stomach was distended with gas. Under fluoroscopic guidance, an 18 gauge needle was utilized to puncture the anterior wall of the body of the stomach. An  Amplatz wire was advanced through the needle passing a T fastener into the lumen of the stomach. The T fastener was secured for gastropexy. A 9-French sheath was inserted. A snare was advanced through the 9-French sheath. A Britta Mccreedy was advanced through the orogastric tube. It was snared then pulled out the oral cavity, pulling the snare, as well. The leading edge of the gastrostomy was attached to the snare. It was then pulled down the esophagus and out the percutaneous site. It was secured in place. Contrast was injected. No complication IMPRESSION: Status post fluoroscopic placed percutaneous gastrostomy tube, with 20 [place] pull-through. Signed, Dulcy Fanny. Earleen Newport, DO [facility] Radiology Electronically Signed   By: Corrie Mckusick D.O.   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  acute on chronic Respiratory failure with hypoxia will continue with present management patient is on 28% FiO2 on the T collar at baseline.   2. Lobar pneumonia treated with antibiotics we will continue follow  3. Acute renal failure follow labs 4. Chronic anoxic encephalopathy clinically improved  5. Pulmonary embolism treated 6. Panhypopituitarism will continue present management   I have personally seen and evaluated the patient, evaluated laboratory and imaging results, formulated the assessment and plan and placed orders. The Patient requires high complexity decision making for assessment and support.  Case was discussed on Rounds with the Respiratory Therapy Staff  Allyne Gee, MD [facility] Pulmonary Critical Care Medicine Sleep Medicine

## 2017-09-13 DIAGNOSIS — J181 Lobar pneumonia, unspecified organism: Secondary | ICD-10-CM | POA: Diagnosis not present

## 2017-09-13 DIAGNOSIS — J9621 Acute and chronic respiratory failure with hypoxia: Secondary | ICD-10-CM | POA: Diagnosis not present

## 2017-09-13 DIAGNOSIS — G931 Anoxic brain damage, not elsewhere classified: Secondary | ICD-10-CM | POA: Diagnosis not present

## 2017-09-13 DIAGNOSIS — N179 Acute kidney failure, unspecified: Secondary | ICD-10-CM | POA: Diagnosis not present

## 2017-09-13 LAB — COMPREHENSIVE METABOLIC PANEL
ALBUMIN: 3.1 g/dL — AB (ref 3.5–5.0)
ALT: 24 U/L (ref 0–44)
ANION GAP: 10 (ref 5–15)
AST: 28 U/L (ref 15–41)
Alkaline Phosphatase: 137 U/L — ABNORMAL HIGH (ref 38–126)
BUN: 13 mg/dL (ref 6–20)
CHLORIDE: 100 mmol/L (ref 98–111)
CO2: 26 mmol/L (ref 22–32)
Calcium: 9.4 mg/dL (ref 8.9–10.3)
Creatinine, Ser: 0.46 mg/dL (ref 0.44–1.00)
GFR calc Af Amer: >60 mL/min (ref >60)
GFR calc non Af Amer: >60 mL/min (ref >60)
GLUCOSE: 77 mg/dL (ref 70–99)
POTASSIUM: 3.4 mmol/L — AB (ref 3.5–5.1)
Sodium: 136 mmol/L (ref 135–145)
TOTAL PROTEIN: 6.9 g/dL (ref 6.5–8.1)
Total Bilirubin: 0.5 mg/dL (ref 0.3–1.2)

## 2017-09-13 LAB — TRIGLYCERIDES: TRIGLYCERIDES: 91 mg/dL (ref <150)

## 2017-09-13 LAB — PHOSPHORUS: PHOSPHORUS: 3.2 mg/dL (ref 2.5–4.6)

## 2017-09-13 LAB — MAGNESIUM: MAGNESIUM: 1.8 mg/dL (ref 1.7–2.4)

## 2017-09-13 LAB — PROTIME-INR
INR: 1.51
PROTHROMBIN TIME: 18.1 s — AB (ref 11.4–15.2)

## 2017-09-13 NOTE — Progress Notes (Signed)
Pulmonary [place]   PULMONARY SERVICE  PROGRESS NOTE  Date of Service: 09/13/2017  Ilyanna Baillargeon  ION:[id number]  DOB: 09/18/1980   DOA: 07/24/2017  Referring Physician: Merton Border, MD  HPI: Debra Sanford is a 37 y.o. female seen for follow up of Acute on Chronic Respiratory Failure.  Patient right now is on room air comfortable without distress she remains on T collar  Medications: Reviewed on Rounds  Physical Exam:  Vitals: Temperature 98.0 pulse 92 respiratory rate 18 blood pressure 124/72 saturations 97%  Ventilator Settings off the ventilator on room air  . General: Comfortable at this time . Eyes: Grossly normal lids, irises & conjunctiva . ENT: grossly tongue is normal . Neck: no obvious mass . Cardiovascular: S1 S2 normal no gallop . Respiratory: No rhonchi or rales are noted at this time . Abdomen: soft . Skin: no rash seen on limited exam . Musculoskeletal: not rigid . Psychiatric:unable to assess . Neurologic: no seizure no involuntary movements         Lab Data:   Basic Metabolic Panel: Recent Labs  Lab 09/07/17 0025 09/07/17 0551 09/08/17 0630 09/10/17 0511 09/13/17 0053 09/13/17 0610  NA  --  134*  --  139  --  136  K  --  3.0* 4.3 4.0  --  3.4*  CL  --  97*  --  100  --  100  CO2  --  28  --  28  --  26  GLUCOSE  --  122*  --  75  --  77  BUN  --  14  --  13  --  13  CREATININE  --  0.38*  --  0.45  --  0.46  CALCIUM  --  9.3  --  10.0  --  9.4  MG 1.6*  --  2.1 1.9 1.8  --   PHOS  --  2.8  --  3.1  --  3.2    Liver Function Tests: Recent Labs  Lab 09/07/17 0551 09/10/17 0511 09/13/17 0610  AST 29 31 28   ALT 22 27 24   ALKPHOS 117 141* 137*  BILITOT 0.5 0.7 0.5  PROT 6.6 7.8 6.9  ALBUMIN 3.0* 3.5 3.1*   No results for input(s): LIPASE, AMYLASE in the last 168 hours. No results for input(s): AMMONIA in the last 168 hours.  CBC: Recent Labs  Lab 09/10/17 0511  09/11/17 0624 09/12/17 0611  WBC 18.7* 18.8* 17.8*  HGB 12.8 12.2 12.4  HCT 41.1 39.6 41.5  MCV 80.7 81.0 82.5  PLT 401* 356 370    Cardiac Enzymes: No results for input(s): CKTOTAL, CKMB, CKMBINDEX, TROPONINI in the last 168 hours.  BNP (last 3 results) No results for input(s): BNP in the last 8760 hours.  ProBNP (last 3 results) No results for input(s): PROBNP in the last 8760 hours.  Radiological Exams: Ir Gastrostomy Tube Mod Sed  Result Date: 09/12/2017 INDICATION: 37 year old female with a history dysphagia EXAM: PERC PLACEMENT GASTROSTOMY MEDICATIONS: 2.0 g Ancef; Antibiotics were administered within 1 hour of the procedure. ANESTHESIA/SEDATION: Versed 2.0 mg IV; Fentanyl 75 mcg IV Moderate Sedation Time:  5 10 minutes The patient was continuously monitored during the procedure by the interventional radiology nurse under my direct supervision. CONTRAST:  10 cc-administered into the gastric lumen. FLUOROSCOPY TIME:  Fluoroscopy Time: 2 minutes 0 seconds (30.2 mGy). COMPLICATIONS: None immediate. PROCEDURE: Informed written consent was obtained from the patient's family after  a thorough discussion of the procedural risks, benefits and alternatives. All questions were addressed. Maximal Sterile Barrier Technique was utilized including caps, mask, sterile gowns, sterile gloves, sterile drape, hand hygiene and skin antiseptic. A timeout was performed prior to the initiation of the procedure. The procedure, risks, benefits, and alternatives were explained to the patient. Questions regarding the procedure were encouraged and answered. The patient understands and consents to the procedure. The epigastrium was prepped with Betadine in a sterile fashion, and a sterile drape was applied covering the operative field. A sterile gown and sterile gloves were used for the procedure. A 5-French orogastric tube is placed under fluoroscopic guidance. Scout imaging of the abdomen confirms barium within the  transverse colon. The stomach was distended with gas. Under fluoroscopic guidance, an 18 gauge needle was utilized to puncture the anterior wall of the body of the stomach. An Amplatz wire was advanced through the needle passing a T fastener into the lumen of the stomach. The T fastener was secured for gastropexy. A 9-French sheath was inserted. A snare was advanced through the 9-French sheath. A Britta Mccreedy was advanced through the orogastric tube. It was snared then pulled out the oral cavity, pulling the snare, as well. The leading edge of the gastrostomy was attached to the snare. It was then pulled down the esophagus and out the percutaneous site. It was secured in place. Contrast was injected. No complication IMPRESSION: Status post fluoroscopic placed percutaneous gastrostomy tube, with 20 [place] pull-through. Signed, Dulcy Fanny. Earleen Newport, DO [facility] Electronically Signed   By: Corrie Mckusick D.O.   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 Acute on chronic respiratory failure with hypoxia continue with T collar patient is at baseline continue secretion management pulmonary toilet prognosis remains guarded. 2. Lobar pneumonia treated with antibiotics 3. Panhypopituitarism at baseline 4. Acute renal failure resolved 5. Chronic anoxic encephalopathy improving 6. Pulmonary embolism treated   I have personally seen and evaluated the patient, evaluated laboratory and imaging results, formulated the assessment and plan and placed orders. The Patient requires high complexity decision making for assessment and support.  Case was discussed on Rounds with the Respiratory  Therapy Staff  Allyne Gee, MD [facility] Pulmonary Critical Care Medicine Sleep Medicine

## 2017-09-14 DIAGNOSIS — J181 Lobar pneumonia, unspecified organism: Secondary | ICD-10-CM | POA: Diagnosis not present

## 2017-09-14 DIAGNOSIS — G931 Anoxic brain damage, not elsewhere classified: Secondary | ICD-10-CM | POA: Diagnosis not present

## 2017-09-14 DIAGNOSIS — N179 Acute kidney failure, unspecified: Secondary | ICD-10-CM | POA: Diagnosis not present

## 2017-09-14 DIAGNOSIS — J9621 Acute and chronic respiratory failure with hypoxia: Secondary | ICD-10-CM | POA: Diagnosis not present

## 2017-09-14 LAB — PROTIME-INR
INR: 1.5
PROTHROMBIN TIME: 17.9 s — AB (ref 11.4–15.2)

## 2017-09-14 NOTE — Progress Notes (Signed)
Pulmonary [place]   PULMONARY SERVICE  PROGRESS NOTE  Date of Service: 09/14/2017  Debra Sanford  VPX:[id number]  DOB: Jan 26, 1981   DOA: 07/24/2017  Referring Physician: Merton Border, MD  HPI: Debra Sanford is a 37 y.o. female seen for follow up of Acute on Chronic Respiratory Failure.  Patient is on T collar currently on 28% oxygen good saturations are noted.  Secretions are fair to moderate  Medications: Reviewed on Rounds  Physical Exam:  Vitals: Temperature 97.7 pulse 90 respiratory rate 14 blood pressure 122/82 saturations 99%  Ventilator Settings on T collar FiO2 28%  . General: Comfortable at this time . Eyes: Grossly normal lids, irises & conjunctiva . ENT: grossly tongue is normal . Neck: no obvious mass . Cardiovascular: S1 S2 normal no gallop . Respiratory: No rhonchi or rales . Abdomen: soft . Skin: no rash seen on limited exam . Musculoskeletal: not rigid . Psychiatric:unable to assess . Neurologic: no seizure no involuntary movements         Lab Data:   Basic Metabolic Panel: Recent Labs  Lab 09/08/17 0630 09/10/17 0511 09/13/17 0053 09/13/17 0610  NA  --  139  --  136  K 4.3 4.0  --  3.4*  CL  --  100  --  100  CO2  --  28  --  26  GLUCOSE  --  75  --  77  BUN  --  13  --  13  CREATININE  --  0.45  --  0.46  CALCIUM  --  10.0  --  9.4  MG 2.1 1.9 1.8  --   PHOS  --  3.1  --  3.2    Liver Function Tests: Recent Labs  Lab 09/10/17 0511 09/13/17 0610  AST 31 28  ALT 27 24  ALKPHOS 141* 137*  BILITOT 0.7 0.5  PROT 7.8 6.9  ALBUMIN 3.5 3.1*   No results for input(s): LIPASE, AMYLASE in the last 168 hours. No results for input(s): AMMONIA in the last 168 hours.  CBC: Recent Labs  Lab 09/10/17 0511 09/11/17 0624 09/12/17 0611  WBC 18.7* 18.8* 17.8*  HGB 12.8 12.2 12.4  HCT 41.1 39.6 41.5  MCV 80.7 81.0 82.5  PLT 401* 356 370    Cardiac Enzymes: No results for  input(s): CKTOTAL, CKMB, CKMBINDEX, TROPONINI in the last 168 hours.  BNP (last 3 results) No results for input(s): BNP in the last 8760 hours.  ProBNP (last 3 results) No results for input(s): PROBNP in the last 8760 hours.  Radiological Exams: No results found.  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)   1. Acute on chronic respiratory failure with hypoxia we will continue with T collar patient's at baseline awaiting discharge 2. Lobar pneumonia treated resolved 3. Panhypopituitarism at baseline 4. Acute renal failure at baseline continue present management 5. Chronic anoxic encephalopathy improving 6. Pulmonary embolism treated   I have personally seen and evaluated the patient, evaluated laboratory and imaging results, formulated the assessment and plan and placed orders. The Patient requires high complexity decision making for assessment and support.  Case was discussed on Rounds with the Respiratory Therapy Staff  Allyne Gee, MD [facility] Pulmonary Critical Care Medicine Sleep Medicine

## 2017-09-15 DIAGNOSIS — G931 Anoxic brain damage, not elsewhere classified: Secondary | ICD-10-CM | POA: Diagnosis not present

## 2017-09-15 DIAGNOSIS — J9621 Acute and chronic respiratory failure with hypoxia: Secondary | ICD-10-CM | POA: Diagnosis not present

## 2017-09-15 DIAGNOSIS — N179 Acute kidney failure, unspecified: Secondary | ICD-10-CM | POA: Diagnosis not present

## 2017-09-15 DIAGNOSIS — J181 Lobar pneumonia, unspecified organism: Secondary | ICD-10-CM | POA: Diagnosis not present

## 2017-09-15 LAB — PROTIME-INR
INR: 1.33
PROTHROMBIN TIME: 16.4 s — AB (ref 11.4–15.2)

## 2017-09-15 NOTE — Progress Notes (Signed)
Pulmonary [place]   PULMONARY SERVICE  PROGRESS NOTE  Date of Service: 09/15/2017  Burlene Montecalvo  OZY:[id number]  DOB: 01-Jun-1980   DOA: 07/24/2017  Referring Physician: Merton Border, MD  HPI: Debra Sanford is a 37 y.o. female seen for follow up of Acute on Chronic Respiratory Failure.  Patient is on baseline T-bar comfortable without distress.  She is failed previous attempts at capping  Medications: Reviewed on Rounds  Physical Exam:  Vitals: Temperature 99.6 pulse 101 respiratory rate 17 blood pressure 164/85 saturations 100%  Ventilator Settings off the ventilator right now on T collar  . General: Comfortable at this time . Eyes: Grossly normal lids, irises & conjunctiva . ENT: grossly tongue is normal . Neck: no obvious mass . Cardiovascular: S1 S2 normal no gallop . Respiratory: No rhonchi or rales . Abdomen: soft . Skin: no rash seen on limited exam . Musculoskeletal: not rigid . Psychiatric:unable to assess . Neurologic: no seizure no involuntary movements         Lab Data:   Basic Metabolic Panel: Recent Labs  Lab 09/10/17 0511 09/13/17 0053 09/13/17 0610  NA 139  --  136  K 4.0  --  3.4*  CL 100  --  100  CO2 28  --  26  GLUCOSE 75  --  77  BUN 13  --  13  CREATININE 0.45  --  0.46  CALCIUM 10.0  --  9.4  MG 1.9 1.8  --   PHOS 3.1  --  3.2    Liver Function Tests: Recent Labs  Lab 09/10/17 0511 09/13/17 0610  AST 31 28  ALT 27 24  ALKPHOS 141* 137*  BILITOT 0.7 0.5  PROT 7.8 6.9  ALBUMIN 3.5 3.1*   No results for input(s): LIPASE, AMYLASE in the last 168 hours. No results for input(s): AMMONIA in the last 168 hours.  CBC: Recent Labs  Lab 09/10/17 0511 09/11/17 0624 09/12/17 0611  WBC 18.7* 18.8* 17.8*  HGB 12.8 12.2 12.4  HCT 41.1 39.6 41.5  MCV 80.7 81.0 82.5  PLT 401* 356 370    Cardiac Enzymes: No results for input(s): CKTOTAL, CKMB, CKMBINDEX, TROPONINI in  the last 168 hours.  BNP (last 3 results) No results for input(s): BNP in the last 8760 hours.  ProBNP (last 3 results) No results for input(s): PROBNP in the last 8760 hours.  Radiological Exams: No results found.  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 Acute on chronic respiratory failure with hypoxia we will continue with T collar trials continue pulmonary toilet. 2. Lobar pneumonia treated with antibiotics we will follow 3. Panhypopituitarism at baseline 4. Chronic anoxic encephalopathy stable 5. Pulmonary embolism treated   I have personally seen and evaluated the patient, evaluated laboratory and imaging results, formulated the assessment and plan and placed orders. The Patient requires high complexity decision making for assessment and support.  Case was discussed on Rounds with the Respiratory Therapy Staff  Allyne Gee, MD [facility] Pulmonary Critical Care Medicine Sleep Medicine

## 2017-09-16 DIAGNOSIS — J181 Lobar pneumonia, unspecified organism: Secondary | ICD-10-CM | POA: Diagnosis not present

## 2017-09-16 DIAGNOSIS — G931 Anoxic brain damage, not elsewhere classified: Secondary | ICD-10-CM | POA: Diagnosis not present

## 2017-09-16 DIAGNOSIS — I2699 Other pulmonary embolism without acute cor pulmonale: Secondary | ICD-10-CM | POA: Diagnosis not present

## 2017-09-16 DIAGNOSIS — E230 Hypopituitarism: Secondary | ICD-10-CM | POA: Diagnosis not present

## 2017-09-16 DIAGNOSIS — J9621 Acute and chronic respiratory failure with hypoxia: Secondary | ICD-10-CM | POA: Diagnosis not present

## 2017-09-16 DIAGNOSIS — N179 Acute kidney failure, unspecified: Secondary | ICD-10-CM | POA: Diagnosis not present

## 2017-09-16 LAB — COMPREHENSIVE METABOLIC PANEL
ALBUMIN: 3.1 g/dL — AB (ref 3.5–5.0)
ALT: 36 U/L (ref 0–44)
AST: 38 U/L (ref 15–41)
Alkaline Phosphatase: 153 U/L — ABNORMAL HIGH (ref 38–126)
Anion gap: 11 (ref 5–15)
BILIRUBIN TOTAL: 0.5 mg/dL (ref 0.3–1.2)
BUN: 13 mg/dL (ref 6–20)
CHLORIDE: 102 mmol/L (ref 98–111)
CO2: 28 mmol/L (ref 22–32)
Calcium: 9.3 mg/dL (ref 8.9–10.3)
Creatinine, Ser: 0.47 mg/dL (ref 0.44–1.00)
GFR calc Af Amer: >60 mL/min (ref >60)
GFR calc non Af Amer: >60 mL/min (ref >60)
GLUCOSE: 74 mg/dL (ref 70–99)
POTASSIUM: 3 mmol/L — AB (ref 3.5–5.1)
Sodium: 141 mmol/L (ref 135–145)
TOTAL PROTEIN: 7.1 g/dL (ref 6.5–8.1)

## 2017-09-16 LAB — TRIGLYCERIDES: Triglycerides: 83 mg/dL (ref <150)

## 2017-09-16 LAB — PROTIME-INR
INR: 1.36
Prothrombin Time: 16.7 seconds — ABNORMAL HIGH (ref 11.4–15.2)

## 2017-09-16 LAB — PHOSPHORUS: Phosphorus: 3.8 mg/dL (ref 2.5–4.6)

## 2017-09-16 LAB — MAGNESIUM: MAGNESIUM: 1.9 mg/dL (ref 1.7–2.4)

## 2017-09-16 NOTE — Progress Notes (Signed)
Pulmonary [place]   PULMONARY SERVICE  PROGRESS NOTE  Date of Service: 09/16/2017  [name]  GDJ:[id number]  DOB: August 25, 1980   DOA: 07/24/2017  Referring Physician: Merton Border, MD  HPI: Debra Sanford is a 37 y.o. female seen for follow up of Acute on Chronic Respiratory Failure.  Patient right now is on T collar comfortable without distress has been on 28% FiO2.  No fevers are noted this morning however did have a temperature overnight  Medications: Reviewed on Rounds  Physical Exam:  Vitals: Temperature 98.4 pulse 67 respiratory rate 23 blood pressure 126/73 saturations 100%  Ventilator Settings on T collar 28% FiO2  . General: Comfortable at this time . Eyes: Grossly normal lids, irises & conjunctiva . ENT: grossly tongue is normal . Neck: no obvious mass . Cardiovascular: S1 S2 normal no gallop . Respiratory: No rhonchi or rales . Abdomen: soft . Skin: no rash seen on limited exam . Musculoskeletal: not rigid . Psychiatric:unable to assess . Neurologic: no seizure no involuntary movements         Lab Data:   Basic Metabolic Panel: Recent Labs  Lab 09/10/17 0511 09/13/17 0053 09/13/17 0610 09/16/17 0006 09/16/17 0613  NA 139  --  136  --  141  K 4.0  --  3.4*  --  3.0*  CL 100  --  100  --  102  CO2 28  --  26  --  28  GLUCOSE 75  --  77  --  74  BUN 13  --  13  --  13  CREATININE 0.45  --  0.46  --  0.47  CALCIUM 10.0  --  9.4  --  9.3  MG 1.9 1.8  --  1.9  --   PHOS 3.1  --  3.2  --  3.8    Liver Function Tests: Recent Labs  Lab 09/10/17 0511 09/13/17 0610 09/16/17 0613  AST 31 28 38  ALT 27 24 36  ALKPHOS 141* 137* 153*  BILITOT 0.7 0.5 0.5  PROT 7.8 6.9 7.1  ALBUMIN 3.5 3.1* 3.1*   No results for input(s): LIPASE, AMYLASE in the last 168 hours. No results for input(s): AMMONIA in the last 168 hours.  CBC: Recent Labs  Lab 09/10/17 0511 09/11/17 0624 09/12/17 0611   WBC 18.7* 18.8* 17.8*  HGB 12.8 12.2 12.4  HCT 41.1 39.6 41.5  MCV 80.7 81.0 82.5  PLT 401* 356 370    Cardiac Enzymes: No results for input(s): CKTOTAL, CKMB, CKMBINDEX, TROPONINI in the last 168 hours.  BNP (last 3 results) No results for input(s): BNP in the last 8760 hours.  ProBNP (last 3 results) No results for input(s): PROBNP in the last 8760 hours.  Radiological Exams: No results found.  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 Acute on chronic respiratory failure with hypoxia patient has been weaning doing fairly well now with a temperature fever will be worked up. 2. Lobar pneumonia treated 3. Panhypopituitarism at baseline 4. Acute renal failure follow labs 5. Chronic encephalopathy unchanged 6. Pulmonary embolism treated   I have personally seen and evaluated the patient, evaluated laboratory and imaging results, formulated the assessment and plan and placed orders. The  Patient requires high complexity decision making for assessment and support.  Case was discussed on Rounds with the Respiratory Therapy Staff  Allyne Gee, MD [facility] Pulmonary Critical Care Medicine Sleep Medicine

## 2017-09-17 DIAGNOSIS — J9621 Acute and chronic respiratory failure with hypoxia: Secondary | ICD-10-CM | POA: Diagnosis not present

## 2017-09-17 DIAGNOSIS — J181 Lobar pneumonia, unspecified organism: Secondary | ICD-10-CM | POA: Diagnosis not present

## 2017-09-17 DIAGNOSIS — G931 Anoxic brain damage, not elsewhere classified: Secondary | ICD-10-CM | POA: Diagnosis not present

## 2017-09-17 DIAGNOSIS — N179 Acute kidney failure, unspecified: Secondary | ICD-10-CM | POA: Diagnosis not present

## 2017-09-17 LAB — PROTIME-INR
INR: 1.36
Prothrombin Time: 16.6 seconds — ABNORMAL HIGH (ref 11.4–15.2)

## 2017-09-17 NOTE — Progress Notes (Signed)
Pulmonary [place]   PULMONARY SERVICE  PROGRESS NOTE  Date of Service: 09/17/2017  Debra Sanford  ZOX:[id number]  DOB: June 17, 1980   DOA: 07/24/2017  Referring Physician: Merton Border, MD  HPI: Debra Sanford is a 37 y.o. female seen for follow up of Acute on Chronic Respiratory Failure.  She is on T collar doing well.  Remains on 20% FiO2 good saturations are noted.  Medications: Reviewed on Rounds  Physical Exam:  Vitals: Temperature 97.4 pulse 96 respiratory rate 20 blood pressure 130/76 saturations are 98%  Ventilator Settings currently off the ventilator on T collar  . General: Comfortable at this time . Eyes: Grossly normal lids, irises & conjunctiva . ENT: grossly tongue is normal . Neck: no obvious mass . Cardiovascular: S1 S2 normal no gallop . Respiratory: Coarse breath sounds no rhonchi or rales . Abdomen: soft . Skin: no rash seen on limited exam . Musculoskeletal: not rigid . Psychiatric:unable to assess . Neurologic: no seizure no involuntary movements         Lab Data:   Basic Metabolic Panel: Recent Labs  Lab 09/13/17 0053 09/13/17 0610 09/16/17 0006 09/16/17 0613  NA  --  136  --  141  K  --  3.4*  --  3.0*  CL  --  100  --  102  CO2  --  26  --  28  GLUCOSE  --  77  --  74  BUN  --  13  --  13  CREATININE  --  0.46  --  0.47  CALCIUM  --  9.4  --  9.3  MG 1.8  --  1.9  --   PHOS  --  3.2  --  3.8    Liver Function Tests: Recent Labs  Lab 09/13/17 0610 09/16/17 0613  AST 28 38  ALT 24 36  ALKPHOS 137* 153*  BILITOT 0.5 0.5  PROT 6.9 7.1  ALBUMIN 3.1* 3.1*   No results for input(s): LIPASE, AMYLASE in the last 168 hours. No results for input(s): AMMONIA in the last 168 hours.  CBC: Recent Labs  Lab 09/11/17 0624 09/12/17 0611  WBC 18.8* 17.8*  HGB 12.2 12.4  HCT 39.6 41.5  MCV 81.0 82.5  PLT 356 370    Cardiac Enzymes: No results for input(s): CKTOTAL, CKMB,  CKMBINDEX, TROPONINI in the last 168 hours.  BNP (last 3 results) No results for input(s): BNP in the last 8760 hours.  ProBNP (last 3 results) No results for input(s): PROBNP in the last 8760 hours.  Radiological Exams: No results found.  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 Acute on chronic respiratory failure with hypoxia she is at baseline on T collar.  Awaiting discharge. 2. Lobar pneumonia treated resolved 3. Panhypopituitarism stable clinically 4. Acute renal failure resolved we will continue to monitor labs 5. Chronic and anoxic encephalopathy she is shown some improvement 6. Pulmonary embolism treated   I have personally seen and evaluated the patient, evaluated laboratory and imaging results, formulated the assessment and plan and placed orders. The Patient requires high complexity decision making for assessment and support.  Case was discussed on Rounds with the Respiratory Therapy Staff  Allyne Gee, MD [facility]  Pulmonary Critical Care Medicine Sleep Medicine

## 2019-02-27 DEATH — deceased

## 2019-10-27 IMAGING — CT CT ABD-PELV W/ CM
2 of 4 series · 16 of 46 positions shown, 18 images · IV contrast (Omni 300)
Comparison: CT scan of August 15, 2017.

CLINICAL DATA: Abdominal abscess.

EXAM:
CT ABDOMEN AND PELVIS WITH CONTRAST
TECHNIQUE: Multidetector CT imaging of the abdomen and pelvis was performed
using the standard protocol following bolus administration of
intravenous contrast.
CONTRAST:  100mL OMNIPAQUE IOHEXOL 300 MG/ML  SOLN

[Series 3: a/p w/ 5mm · axial · 0.90mm/px · z∈[+846,+1286]mm · 13 of 96 slices shown, 15 images]
[im 4/96  soft-tissue]
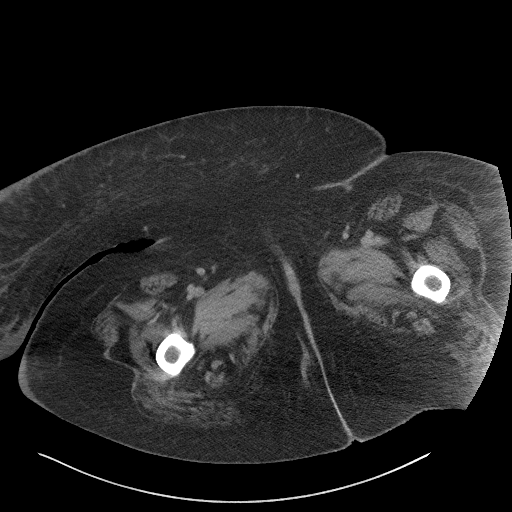
[im 4/96  bone]
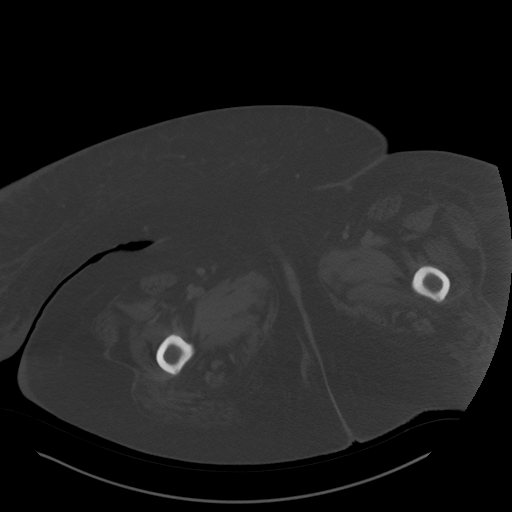
[im 11/96  soft-tissue]
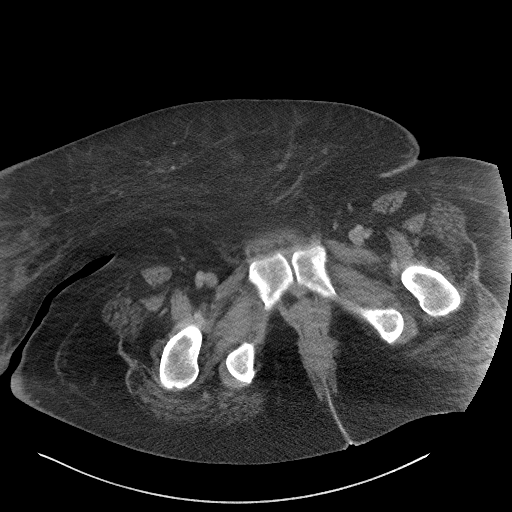
[im 19/96  soft-tissue]
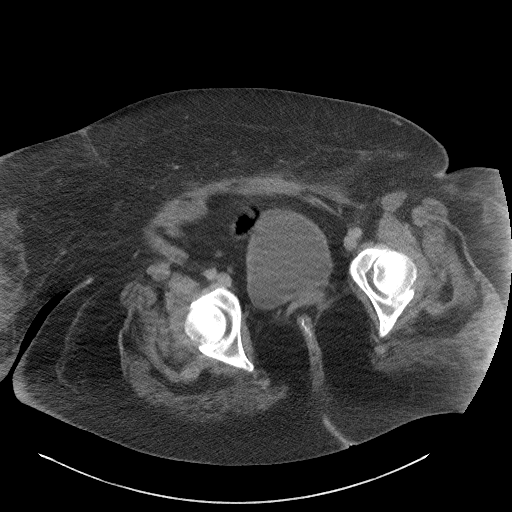
[im 26/96  soft-tissue]
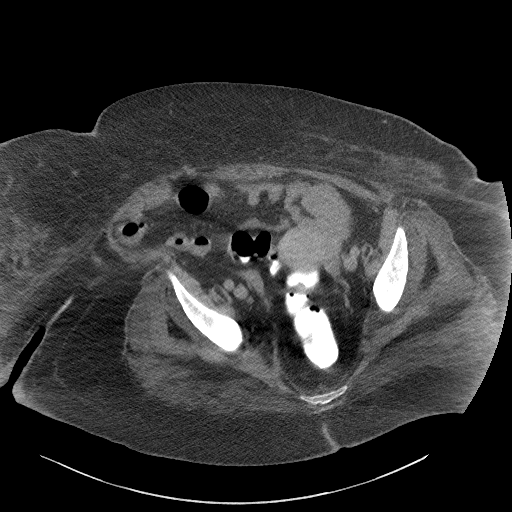
[im 33/96  soft-tissue]
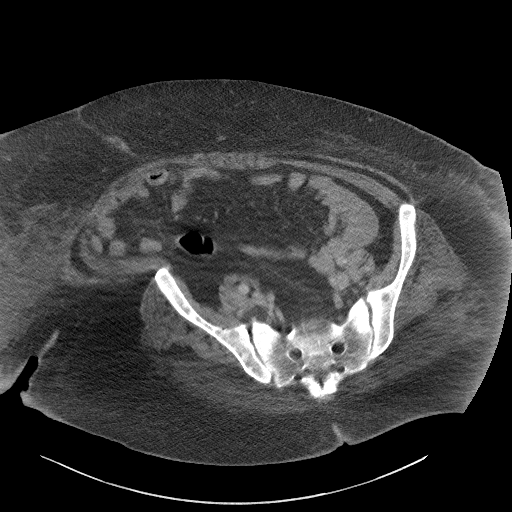
[im 41/96  soft-tissue]
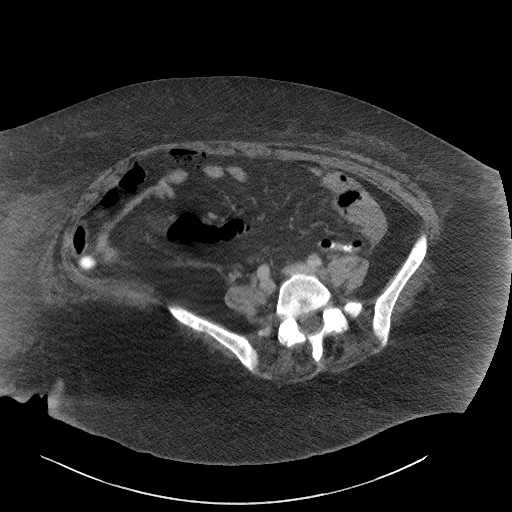
[im 48/96  soft-tissue]
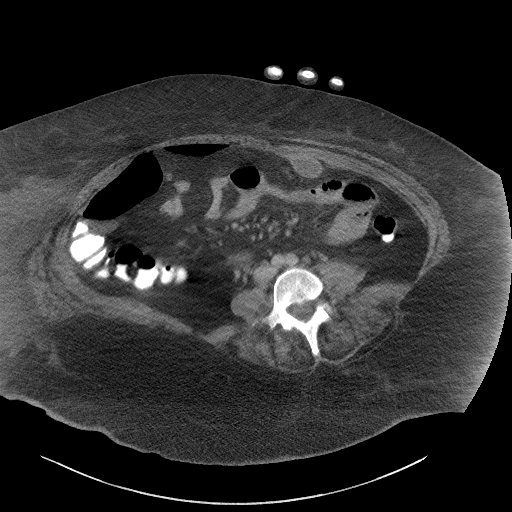
[im 55/96  soft-tissue]
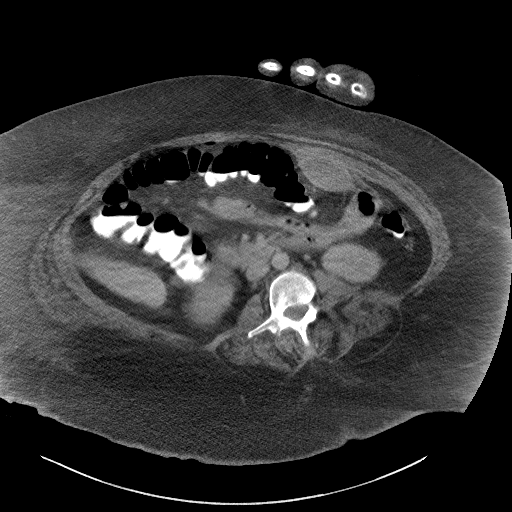
[im 63/96  soft-tissue]
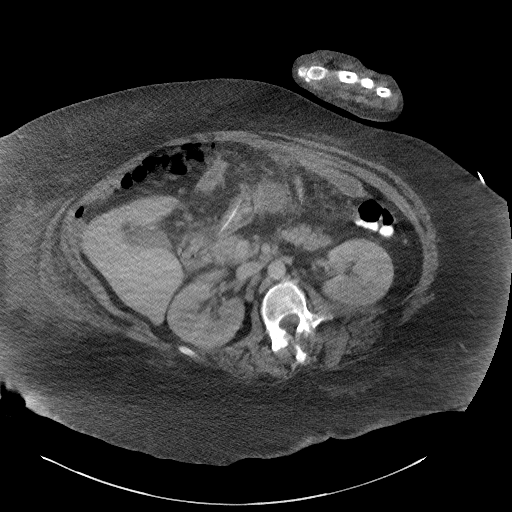
[im 63/96  bone]
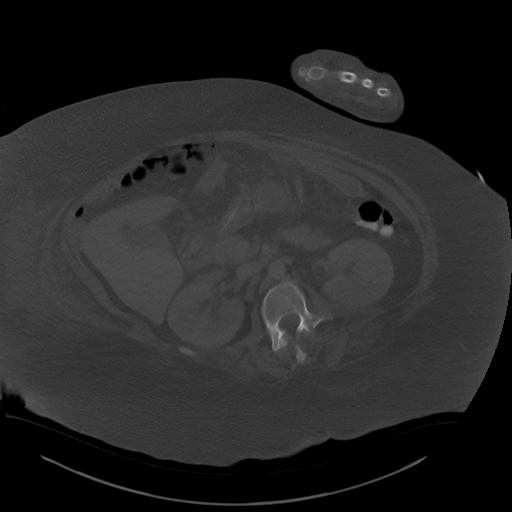
[im 70/96  soft-tissue]
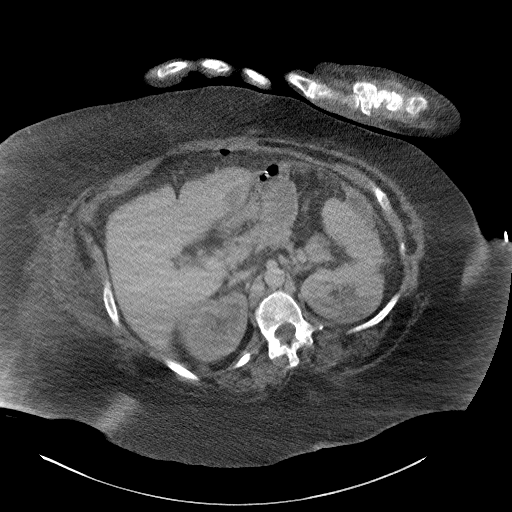
[im 77/96  soft-tissue]
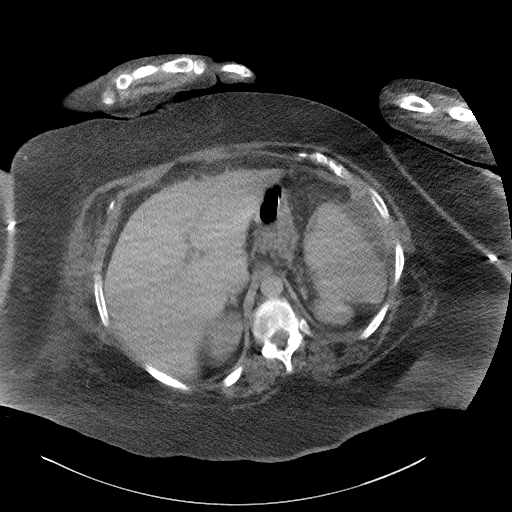
[im 85/96  soft-tissue]
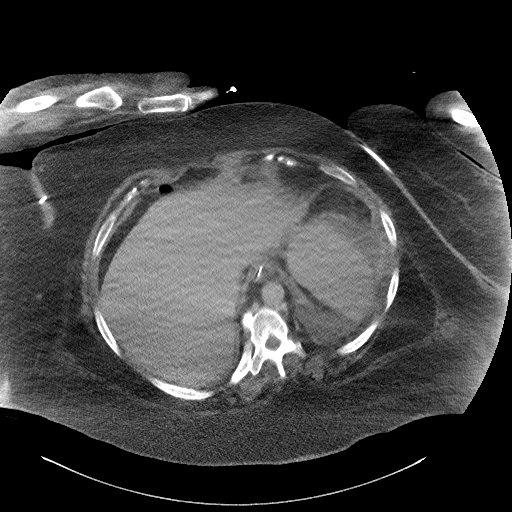
[im 92/96  soft-tissue]
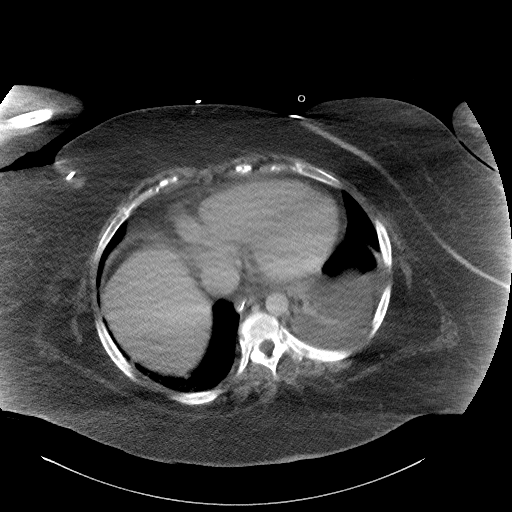

[Series 6: a/p w/ cor · coronal · 0.98mm/px · 3 of 180 slices shown]
[im 60/180  soft-tissue]
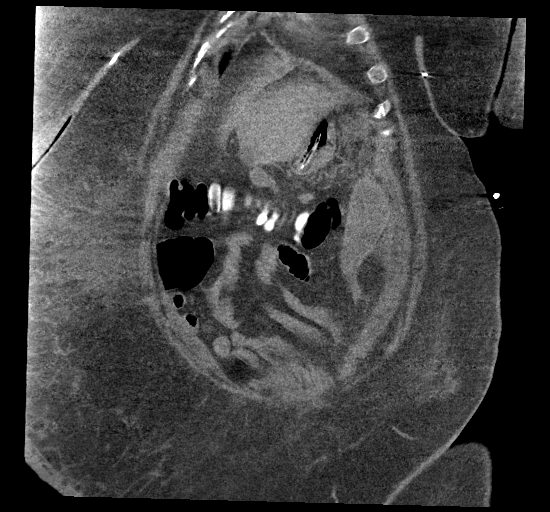
[im 80/180  soft-tissue]
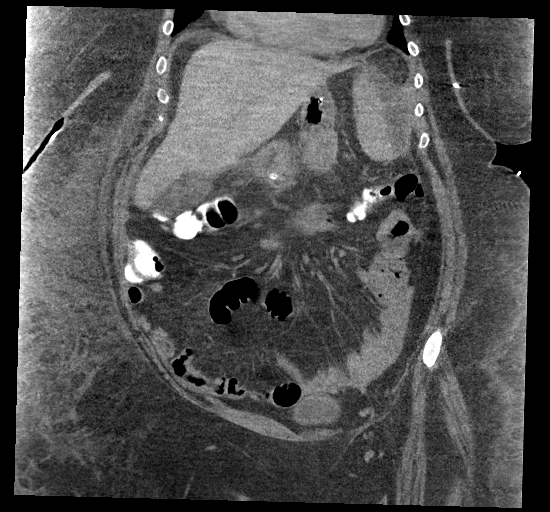
[im 100/180  soft-tissue]
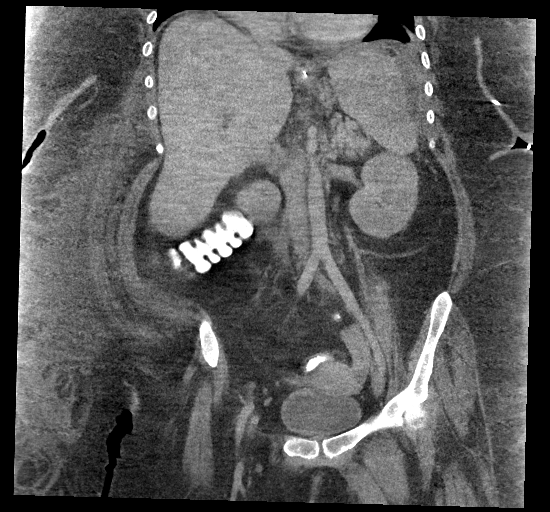

[16 of 46 positions shown; findings below may reference images not displayed]

FINDINGS: Lower chest: Mild left pleural effusion with adjacent subsegmental
atelectasis. Mild right basilar subsegmental atelectasis is noted as
well.

Hepatobiliary: No focal liver abnormality is seen. No gallstones,
gallbladder wall thickening, or biliary dilatation.

Pancreas: Unremarkable. No pancreatic ductal dilatation or
surrounding inflammatory changes.

Spleen: Stable mild fluid collection is noted around the spleen.

Adrenals/Urinary Tract: Adrenal glands are unremarkable. Kidneys are
normal, without renal calculi, focal lesion, or hydronephrosis.
Bladder is unremarkable.

Stomach/Bowel: Nasogastric tube tip is seen in distal stomach. There
is no evidence of bowel obstruction or inflammation. 4.2 x 3.5 cm
fluid collection is seen along the greater curvature of the body of
the stomach which is not significantly changed compared to prior
exam.

Vascular/Lymphatic: No significant vascular findings are present. No
enlarged abdominal or pelvic lymph nodes.

Reproductive: Uterus and bilateral adnexa are unremarkable.

Other: Amount of pneumoperitoneum identified is significantly
smaller compared to prior exam. [DATE] x 3.0 cm fluid collection is
seen in left side of the abdomen along anterior abdominal wall which
is not significantly changed compared to prior exam.

Musculoskeletal: No acute or significant osseous findings.
IMPRESSION: Pneumoperitoneum noted on prior exam is significantly smaller
currently.

6.8 x 3.0 cm fluid collection is seen along the anterior wall of the
left side of the abdomen which is not significantly changed compared
to prior exam.

Grossly stable 4.2 x 3.5 cm fluid collection is noted along greater
curvature of body of stomach.

Mild left pleural effusion is noted with adjacent subsegmental
atelectasis.
